# Patient Record
Sex: Male | Born: 1990 | Race: Black or African American | Hispanic: No | Marital: Single
Health system: Midwestern US, Community
[De-identification: ages and names within clinical notes are randomized; demographics above are authoritative.]

---

## 2012-05-31 ENCOUNTER — Inpatient Hospital Stay
Admit: 2012-05-31 | Discharge: 2012-06-01 | Disposition: A | Discharge status: Home / Self Care | Attending: Emergency Medicine

## 2012-05-31 NOTE — ED Provider Notes (Signed)
HPI:  05/31/2012,   Time: 8:58 PM         Dave Huffman is a 21 y.o. male presenting to the ED for Right hand and arm discomfort, beginning One week ago.  The complaint has been persistent, mild in severity, and worsened by nothing.  he denies any trauma denies any injury to his right hand. He states his hand hurts worse when he tries to look at his cell phone.    ROS:   Unless otherwise stated in this report or unable to obtain because of the patient's clinical or mental status as evidenced by the medical record, this patients's positive and negative responses for Review of Systems, constitutional, psych, eyes, ENT, cardiovascular, respiratory, gastrointestinal, neurological, genitourinary, musculoskeletal, integument systems and systems related to the presenting problem are either stated in the preceding or were not pertinent or were negative for the symptoms and/or complaints related to the medical problem.      --------------------------------------------- PAST HISTORY ---------------------------------------------  Past Medical History:  has no past medical history on file.    Past Surgical History:  has no past surgical history on file.    Social History:  reports that he has been smoking Cigarettes.  He has been smoking about 0.50 packs per day. He does not have any smokeless tobacco history on file. He reports that  drinks alcohol. He reports that he does not use illicit drugs.    Family History: family history is not on file.     The patient???s home medications have been reviewed.    Allergies: Review of patient's allergies indicates no known allergies.    -------------------------------------------------- RESULTS -------------------------------------------------  All laboratory and radiology results have been personally reviewed by myself   LABS:  No results found for this visit on 05/31/12.    RADIOLOGY:  Interpreted by Radiologist.  XR HAND RIGHT STANDARD    Final Result:        XR RADIUS ULNA RIGHT  STANDARD    Final Result:            ------------------------- NURSING NOTES AND VITALS REVIEWED ---------------------------   The nursing notes within the ED encounter and vital signs as below have been reviewed.   BP 116/68   Pulse 87   Temp(Src) 98.5 ??F (36.9 ??C) (Oral)   Resp 18   Ht 5\' 10"  (1.778 m)   Wt 168 lb (76.204 kg)   BMI 24.11 kg/m2   SpO2 98%  Oxygen Saturation Interpretation: Normal      ---------------------------------------------------PHYSICAL EXAM--------------------------------------      Constitutional/General: Alert and oriented x3, well appearing, non toxic in NAD  Head: NC/AT  Eyes: PERRL, EOMI  Mouth: Oropharynx clear, handling secretions, no trismus  Neck: Supple, full ROM, no meningeal signs  Pulmonary: Lungs clear to auscultation bilaterally, no wheezes, rales, or rhonchi. Not in respiratory distress  Cardiovascular:  Regular rate and rhythm, no murmurs, gallops, or rubs. 2+ distal pulses  Abdomen: Soft, non tender, non distended,   Extremities: Moves all extremities x 4. Warm and well perfusedNegative Tinel's sign for right hand. It appears swollen it is not appear contused it is not red.  Skin: warm and dry without rash  Neurologic: GCS 15,  Psych: Normal Affect      ------------------------------ ED COURSE/MEDICAL DECISION MAKING----------------------  Medications   naproxen (NAPROSYN) tablet 500 mg (500 mg Oral Given 05/31/12 2126)         Medical Decision Making:    Carpal tunnel syndrome    Counseling:  The emergency provider has spoken with the patient and discussed today???s results, in addition to providing specific details for the plan of care and counseling regarding the diagnosis and prognosis.  Questions are answered at this time and they are agreeable with the plan.      --------------------------------- IMPRESSION AND DISPOSITION ---------------------------------    IMPRESSION  1. Carpal tunnel syndrome        DISPOSITION  Disposition: Discharge to home  Patient condition is  good                  Harrison Mons, [place]  05/31/12 2103    ATTENDING PROVIDER ATTESTATION:     I have personally performed and/or participated in the history, exam, medical decision making, and procedures and agree with all pertinent clinical information.      I have also reviewed and agree with the past medical, family and social history unless otherwise noted.      Cathren Laine, MD  06/01/12 [phone]

## 2012-05-31 NOTE — ED Notes (Signed)
Discharge instructions given verbally and written, patient has no further concerns at this time.     Darrel Hoover, RN  05/31/12 2128

## 2012-05-31 NOTE — Discharge Instructions (Signed)
Carpal Tunnel Syndrome  The carpal tunnel is a narrow area located on the palm side of your wrist. The tunnel is formed by the wrist bones and ligaments. Nerves, blood vessels, and tendons pass through the carpal tunnel. Repeated wrist motion or certain diseases may cause swelling within the tunnel. This swelling pinches the main nerve in the wrist (median nerve) and causes the painful hand and arm condition called carpal tunnel syndrome.  CAUSES    Repeated wrist motions.   Wrist injuries.   Certain diseases like arthritis, diabetes, alcoholism, hyperthyroidism, and kidney failure.   Obesity.   Pregnancy.  SYMPTOMS    A "pins and needles" feeling in your fingers or hand.   Tingling or numbness in your fingers or hand.   An aching feeling in your entire arm.   Wrist pain that goes up your arm to your shoulder.   Pain that goes down into your palm or fingers.   A weak feeling in your hands.  DIAGNOSIS   Your caregiver will take your history and perform a physical exam. An electromyography test may be needed. This test measures electrical signals sent out by the muscles. The electrical signals are usually slowed by carpal tunnel syndrome. You may also need X-rays.  TREATMENT   Carpal tunnel syndrome may clear up by itself. Your caregiver may recommend a wrist splint or medicine such as a nonsteroidal anti-inflammatory medicine. Cortisone injections may help. Sometimes, surgery may be needed to free the pinched nerve.   HOME CARE INSTRUCTIONS    Take all medicine as directed by your caregiver. Only take over-the-counter or prescription medicines for pain, discomfort, or fever as directed by your caregiver.   If you were given a splint to keep your wrist from bending, wear it as directed. It is important to wear the splint at night. Wear the splint for as long as you have pain or numbness in your hand, arm, or wrist. This may take 1 to 2 months.   Rest your wrist from any activity that may be causing your  pain. If your symptoms are work-related, you may need to talk to your employer about changing to a job that does not require using your wrist.   Put ice on your wrist after long periods of wrist activity.   Put ice in a plastic bag.   Place a towel between your skin and the bag.   Leave the ice on for 15 to 20 minutes, 3 to 4 times a day.   Keep all follow-up visits as directed by your caregiver. This includes any orthopedic referrals, physical therapy, and rehabilitation. Any delay in getting necessary care could result in a delay or failure of your condition to heal.  SEEK IMMEDIATE MEDICAL CARE IF:    You have new, unexplained symptoms.   Your symptoms get worse and are not helped or controlled with medicines.  MAKE SURE YOU:    Understand these instructions.   Will watch your condition.   Will get help right away if you are not doing well or get worse.  Document Released: 05/27/2000 Document Revised: 08/22/2011 Document Reviewed: 04/15/2011  [facility] Patient Information 2013 [place].

## 2012-06-01 MED ORDER — NAPROXEN 500 MG PO TABS
500 MG | ORAL_TABLET | Freq: Two times a day (BID) | ORAL | Status: DC
Start: 2012-06-01 — End: 2015-04-25

## 2012-06-01 MED ADMIN — naproxen (NAPROSYN) tablet 500 mg: ORAL | @ 02:00:00 | NDC 00904606961

## 2012-06-01 MED FILL — NAPROXEN 250 MG PO TABS: 250 MG | ORAL | Qty: 2

## 2012-12-12 ENCOUNTER — Inpatient Hospital Stay
Admit: 2012-12-12 | Discharge: 2012-12-13 | Disposition: A | Discharge status: Home / Self Care | Attending: Emergency Medicine

## 2012-12-12 LAB — URINALYSIS
Bilirubin Urine: NEGATIVE
Blood, Urine: NEGATIVE
Glucose, Ur: NEGATIVE mg/dL
Ketones, Urine: NEGATIVE mg/dL
Leukocyte Esterase, Urine: NEGATIVE
Nitrite, Urine: NEGATIVE
Protein, UA: NEGATIVE mg/dL
Specific Gravity, UA: 1.02 (ref 1.005–1.030)
Urobilinogen, Urine: 2 E.U./dL — AB (ref <2.0)
pH, UA: 7.5 (ref 5.0–9.0)

## 2012-12-12 LAB — MICROSCOPIC URINALYSIS

## 2012-12-12 MED ORDER — LIDOCAINE HCL (PF) 1 % IJ SOLN
1 % | INTRAMUSCULAR | Status: AC
Start: 2012-12-12 — End: 2012-12-12
  Administered 2012-12-12: 23:00:00

## 2012-12-12 MED ORDER — CEFTRIAXONE SODIUM 1 G IJ SOLR
1 | Freq: Once | INTRAMUSCULAR | Status: AC
Start: 2012-12-12 — End: 2012-12-12
  Administered 2012-12-12: 23:00:00 via INTRAMUSCULAR

## 2012-12-12 MED ORDER — AZITHROMYCIN 250 MG PO TABS
250 | Freq: Once | ORAL | Status: AC
Start: 2012-12-12 — End: 2012-12-12
  Administered 2012-12-12: 23:00:00 via ORAL

## 2012-12-12 MED ORDER — ACYCLOVIR 800 MG PO TABS
800 MG | ORAL_TABLET | Freq: Every day | ORAL | Status: AC
Start: 2012-12-12 — End: 2012-12-19

## 2012-12-12 MED ORDER — TRAMADOL HCL 50 MG PO TABS
50 MG | ORAL_TABLET | Freq: Four times a day (QID) | ORAL | Status: AC | PRN
Start: 2012-12-12 — End: 2012-12-17

## 2012-12-12 MED FILL — CEFTRIAXONE SODIUM 1 G IJ SOLR: 1 g | INTRAMUSCULAR | Qty: 1

## 2012-12-12 MED FILL — AZITHROMYCIN 250 MG PO TABS: 250 MG | ORAL | Qty: 4

## 2012-12-12 MED FILL — LIDOCAINE HCL (PF) 1 % IJ SOLN: 1 % | INTRAMUSCULAR | Qty: 2

## 2012-12-12 NOTE — Discharge Instructions (Signed)
Genital Herpes  Genital herpes is a sexually transmitted disease. This means that it is a disease passed by having sex with an infected person. There is no cure for genital herpes. The time between attacks can be months to years. The virus may live in a person but produce no problems (symptoms). This infection can be passed to a baby as it travels down the birth canal (vagina). In a newborn, this can cause central nervous system damage, eye damage, or even death. The virus that causes genital herpes is usually HSV-2 virus. The virus that causes oral herpes is usually HSV-1. The diagnosis (learning what is wrong) is made through culture results.  SYMPTOMS   Usually symptoms of pain and itching begin a few days to a week after contact. It first appears as small blisters that progress to small painful ulcers which then scab over and heal after several days. It affects the outer genitalia, birth canal, cervix, penis, anal area, buttocks, and thighs.  HOME CARE INSTRUCTIONS    Keep ulcerated areas dry and clean.   Take medications as directed. Antiviral medications can speed up healing. They will not prevent recurrences or cure this infection. These medications can also be taken for suppression if there are frequent recurrences.   While the infection is active, it is contagious. Avoid all sexual contact during active infections.   Condoms may help prevent spread of the herpes virus.   Practice safe sex.   Wash your hands thoroughly after touching the genital area.   Avoid touching your eyes after touching your genital area.   Inform your caregiver if you have had genital herpes and become pregnant. It is your responsibility to insure a safe outcome for your baby in this pregnancy.   Only take over-the-counter or prescription medicines for pain, discomfort, or fever as directed by your caregiver.  SEEK MEDICAL CARE IF:    You have a recurrence of this infection.   You do not respond to medications and are not  improving.   You have new sources of pain or discharge which have changed from the original infection.   You have an oral temperature above 102 F (38.9 C).   You develop abdominal pain.   You develop eye pain or signs of eye infection.  Document Released: 05/27/2000 Document Revised: 08/22/2011 Document Reviewed: 06/17/2009  [facility] Patient Information 2014 [place], [place].  Sexually Transmitted Disease  Sexually transmitted disease (STD) refers to any infection that is passed from person to person during sexual activity. This may happen by way of saliva, semen, blood, vaginal mucus, or urine. Common STDs include:   Gonorrhea.   Chlamydia.   Syphilis.   HIV/AIDS.   Genital herpes.   Hepatitis B and C.   Trichomonas.   Human papillomavirus (HPV).   Pubic lice.  CAUSES   An STD may be spread by bacteria, virus, or parasite. A person can get an STD by:   Sexual intercourse with an infected person.   Sharing sex toys with an infected person.   Sharing needles with an infected person.   Having intimate contact with the genitals, mouth, or rectal areas of an infected person.  SYMPTOMS   Some people may not have any symptoms, but they can still pass the infection to others. Different STDs have different symptoms. Symptoms include:   Painful or bloody urination.   Pain in the pelvis, abdomen, vagina, anus, throat, or eyes.   Skin rash, itching, irritation, growths, or sores (lesions). These usually  occur in the genital or anal area.   Abnormal vaginal discharge.   Penile discharge in men.   Soft, flesh-colored skin growths in the genital or anal area.   Fever.   Pain or bleeding during sexual intercourse.   Swollen glands in the groin area.   Yellow skin and eyes (jaundice). This is seen with hepatitis.  DIAGNOSIS   To make a diagnosis, your caregiver may:   Take a medical history.   Perform a physical exam.   Take a specimen (culture) to be examined.   Examine a sample of discharge under a  microscope.   Perform blood tests.   Perform a Pap test, if this applies.   Perform a colposcopy.   Perform a laparoscopy.  TREATMENT    Chlamydia, gonorrhea, trichomonas, and syphilis can be cured with antibiotic medicine.   Genital herpes, hepatitis, and HIV can be treated, but not cured, with prescribed medicines. The medicines will lessen the symptoms.   Genital warts from HPV can be treated with medicine or by freezing, burning (electrocautery), or surgery. Warts may come back.   HPV is a virus and cannot be cured with medicine or surgery.However, abnormal areas may be followed very closely by your caregiver and may be removed from the cervix, vagina, or vulva through office procedures or surgery.  If your diagnosis is confirmed, your recent sexual partners need treatment. This is true even if they are symptom-free or have a negative culture or evaluation. They should not have sex until their caregiver says it is okay.  HOME CARE INSTRUCTIONS   All sexual partners should be informed, tested, and treated for all STDs.   Take your antibiotics as directed. Finish them even if you start to feel better.   Only take over-the-counter or prescription medicines for pain, discomfort, or fever as directed by your caregiver.   Rest.   Eat a balanced diet and drink enough fluids to keep your urine clear or pale yellow.   Do not have sex until treatment is completed and you have followed up with your caregiver. STDs should be checked after treatment.   Keep all follow-up appointments, Pap tests, and blood tests as directed by your caregiver.   Only use latex condoms and water-soluble lubricants during sexual activity. Do not use petroleum jelly or oils.   Avoid alcohol and illegal drugs.   Get vaccinated for HPV and hepatitis. If you have not received these vaccines in the past, talk to your caregiver about whether one or both might be right for you.   Avoid risky sex practices that can break the  skin.  The only way to avoid getting an STD is to avoid all sexual activity.Latex condoms and dental dams (for oral sex) will help lessen the risk of getting an STD, but will not completely eliminate the risk.  SEEK MEDICAL CARE IF:    You have a fever.   You have any new or worsening symptoms.  Document Released: 08/20/2002 Document Revised: 08/22/2011 Document Reviewed: 08/27/2010  [facility] Patient Information 2014 [place], [place].    Safer Sex: After Your Visit  Your Care Instructions  Safer sex is a way to reduce your risk of getting an infection spread through sex. It can also help prevent pregnancy. Most infections that are spread through sex, also called sexually transmitted infections or STIs, can be cured. But some can decrease your chances of getting pregnant if they are not treated early. Others, such as herpes, have no cure. And  some, such as HIV, can be deadly.  Several products can help you practice safer sex and reduce your chance of STIs. One of the best is a condom. There are condoms for men and for women. The male condom is a tube of soft plastic with a closed end that is placed deep into the vagina. You can use a special rubber sheet (dental dam) for protection during oral sex. Latex gloves can keep your hands from touching blood, semen, or other body fluids that can carry infections.  Remember that birth control methods such as diaphragms, IUDs, foams, and birth control pills do not stop you from getting STIs.  Follow-up care is a key part of your treatment and safety. Be sure to make and go to all appointments, and call your doctor if you are having problems. It's also a good idea to know your test results and keep a list of the medicines you take.  How can you care for yourself at home?   Think about getting shots to prevent hepatitis A and hepatitis B. These two diseases can be spread through sex. You also can get hepatitis A if you eat infected food.   Use condoms or male condoms  each time and every time you have sex.   Learn the right way to use a male condom:   Condoms come in several sizes. Make sure you use the right size. A condom that is too small can break easily. A condom that is too big can slip off during sex. Use a new condom each time you have sex.   Be careful not to poke a hole in the condom when you open the wrapper.   Squeeze the tip of the condom to keep out air.   Pull down the loose skin (foreskin) from the head of an uncircumcised penis.   While squeezing the tip of the condom, unroll it all the way down to the base of the firm penis.   Never use petroleum jelly (such as Vaseline), grease, hand lotion, baby oil, or anything with oil in it. These products can make holes in the condom.   After sex, hold the condom on your penis as you remove your penis from your partner. This will keep semen from spilling out of the condom.   Learn to use a male condom:   You can put in a male condom up to 8 hours before sex.   Squeeze the smaller ring at the closed end and insert it deep into the vagina. The larger ring at the open end should stay outside the vagina.   During sex, make sure the penis goes into the condom.   After the penis is removed, close the open end of the condom by twisting it. Remove the condom.   Do not use a male condom and male condom at the same time.   Do not have sex with anyone who has symptoms of an STI, such as sores on the genitals or mouth. The herpes virus that causes cold sores can spread to and from the penis and vagina.   Do not drink a lot of alcohol or use drugs before sex. This can cause you to let down your guard and not practice safer sex.   Having one sex partner (who does not have STIs and does not have sex with anyone else) is a sure way to avoid STIs.   Talk to your partner before you have sex. Find out if he or she  has or is at risk for any STI. Keep in mind that a person may be able to spread an STI even if he or she  does not have symptoms. You and your partner may want to get an HIV test. You should get tested again 6 months later.   Where can you learn more?   Go to [email or web address] and sign in to your MyChart account. Enter [phone] in the Search Health Information box to learn more about "Safer Sex: After Your Visit."    If you do not have an account, please click on the "Sign Up Now" link.      2006-2014 [employer]. Care instructions adapted under license by [facility]. This care instruction is for use with your licensed healthcare professional. If you have questions about a medical condition or this instruction, always ask your healthcare professional. [employer], Inc[employer] disclaims any warranty or liability for your use of this information.  Content Version: 10.0.273164; Last Revised: July 01, 2010

## 2012-12-12 NOTE — ED Provider Notes (Addendum)
HPI:  12/12/2012,   Time: 6:13 PM         Dave Huffman is a 22 y.o. male presenting to the ED for STD exposure, beginning one week ago.  The complaint has been constant, mild in severity, and worsened by nothing.  Ambulatory into the emergency room complaining of an exposure to STD. States he recently had unprotected intercourse with a male and one week later he began developing "bumps" to the base of the penis. He denies any penile discharge. He states he has intermittent episodes of some dysuria symptoms. His abdominal pain or fever. Denies any swelling or abnormality to the penis.    ROS:   Unless otherwise stated in this report or unable to obtain because of the patient's clinical or mental status as evidenced by the medical record, this patients's positive and negative responses for Review of Systems, constitutional, psych, eyes, ENT, cardiovascular, respiratory, gastrointestinal, neurological, genitourinary, musculoskeletal, integument systems and systems related to the presenting problem are either stated in the preceding or were not pertinent or were negative for the symptoms and/or complaints related to the medical problem.      --------------------------------------------- PAST HISTORY ---------------------------------------------  Past Medical History:  has no past medical history on file.    Past Surgical History:  has no past surgical history on file.    Social History:  reports that he has been smoking Cigarettes.  He has been smoking about 0.50 packs per day. He does not have any smokeless tobacco history on file. He reports that  drinks alcohol. He reports that he does not use illicit drugs.    Family History: family history is not on file.     The patient???s home medications have been reviewed.    Allergies: Review of patient's allergies indicates no known allergies.    -------------------------------------------------- RESULTS -------------------------------------------------  All laboratory and  radiology results have been personally reviewed by myself   LABS:  Results for orders placed during the hospital encounter of 12/12/12   URINALYSIS       Result Value Range    Color, UA Yellow  Straw/Yellow    Clarity, UA SL CLOUDY  Clear    Glucose, Ur Negative  Negative mg/dL    Bilirubin Urine Negative  Negative    Ketones, Urine Negative  Negative mg/dL    Specific Gravity, UA 1.020  1.005 - 1.030    Blood, Urine Negative  Negative    pH, UA 7.5  5.0 - 9.0    Protein, UA Negative  Negative mg/dL    Urobilinogen, Urine 2.0 (*) < 2.0 E.U./dL    Nitrite, Urine Negative  Negative    Leukocyte Esterase, Urine Negative  Negative   MICROSCOPIC URINALYSIS       Result Value Range    WBC, UA NONE  0 - 5 /HPF    RBC, UA 5-10 (*) 0 - 2 /HPF    Bacteria, UA NONE      Amorphous, UA MODERATE         RADIOLOGY:  Interpreted by Radiologist.       ------------------------- NURSING NOTES AND VITALS REVIEWED ---------------------------   The nursing notes within the ED encounter and vital signs as below have been reviewed.   BP 149/71   Pulse 74   Temp(Src) 98 ??F (36.7 ??C) (Oral)   Ht 5\' 11"  (1.803 m)   Wt 155 lb (70.308 kg)   BMI 21.63 kg/m2   SpO2 98%  Oxygen Saturation Interpretation: Normal      ---------------------------------------------------  PHYSICAL EXAM--------------------------------------      Constitutional/General: Alert and oriented x3, well appearing, non toxic in NAD  Head: NC/AT  Eyes: PERRL, EOMI  Mouth: Oropharynx clear, handling secretions, no trismus  Neck: Supple, full ROM, no meningeal signs  Pulmonary: Lungs clear to auscultation bilaterally, no wheezes, rales, or rhonchi. Not in respiratory distress  Cardiovascular:  Regular rate and rhythm, no murmurs, gallops, or rubs. 2+ distal pulses  Abdomen: Soft, non tender, non distended,   GU:  Erythematous,  Vesicular,  appearing rash to the base of the penis. There is no penile discharge.  Extremities: Moves all extremities x 4. Warm and well perfused  Skin:  warm and dry without rash  Neurologic: GCS 15,  Psych: Normal Affect      ------------------------------ ED COURSE/MEDICAL DECISION MAKING----------------------  Medications   cefTRIAXone (ROCEPHIN) injection 1 g (1 g Intramuscular Given 12/12/12 1851)   azithromycin (ZITHROMAX) tablet 1,000 mg (1,000 mg Oral Given 12/12/12 1851)   lidocaine 1 % (PF) injection (2 mLs  Given 12/12/12 1851)         Medical Decision Making:    1830- Examined by Dr. Margretta Ditty, instructed [facility] in the next 2-3 days if no improvement in symptoms. Patient was counseled at length on safe sex practices including consistent condom use.    Counseling:   The emergency provider has spoken with the patient and discussed today???s results, in addition to providing specific details for the plan of care and counseling regarding the diagnosis and prognosis.  Questions are answered at this time and they are agreeable with the plan.      --------------------------------- IMPRESSION AND DISPOSITION ---------------------------------    IMPRESSION  1. Genital herpes    2. Exposure to STD        DISPOSITION  Disposition: Discharge to home  Patient condition is good          ATTENDING PROVIDER ATTESTATION:     I have personally performed and/or participated in the history, exam, medical decision making, and procedures and agree with all pertinent clinical information.      I have also reviewed and agree with the past medical, family and social history unless otherwise noted.          Merian Capron, APRN  12/13/12 0115    I have examined the patient along with the mid-level provider, and I agree with the treatment and discharge plan    Gaspar Skeeters, MD  12/13/12 1154    Gaspar Skeeters, MD  04/07/13 2041

## 2012-12-14 LAB — CULTURE, HSV: Organism: DETECTED — AB

## 2012-12-19 LAB — C. TRACHOMATIS / N. GONORRHOEAE, DNA
C. Trachomatis Amplified: POSITIVE — AB
N. Gonorrhoeae Amplified: NEGATIVE

## 2013-06-24 ENCOUNTER — Inpatient Hospital Stay: Admit: 2013-06-24 | Discharge: 2013-06-24 | Attending: Emergency Medicine

## 2013-06-24 LAB — COMPREHENSIVE METABOLIC PANEL
ALT: 28 U/L (ref 10–40)
AST: 30 U/L (ref 15–37)
Albumin/Globulin Ratio: 1.3 (ref 1.1–2.2)
Albumin: 4.7 g/dL (ref 3.4–5.0)
Alkaline Phosphatase: 80 U/L (ref 40–129)
Anion Gap: 10 (ref 3–16)
BUN: 6 mg/dL — ABNORMAL LOW (ref 7–20)
CO2: 24 mmol/L (ref 21–32)
Calcium: 9.6 mg/dL (ref 8.3–10.6)
Chloride: 101 mmol/L (ref 99–110)
Creatinine: 1 mg/dL (ref 0.9–1.3)
GFR African American: >60 (ref >60)
GFR Non-African American: >60 (ref >60)
Globulin: 3.5 g/dL
Glucose: 101 mg/dL — ABNORMAL HIGH (ref 70–99)
Potassium: 3.9 mmol/L (ref 3.5–5.1)
Sodium: 135 mmol/L — ABNORMAL LOW (ref 136–145)
Total Bilirubin: 0.3 mg/dL (ref 0.0–1.0)
Total Protein: 8.2 g/dL (ref 6.4–8.2)

## 2013-06-24 LAB — CBC WITH AUTO DIFFERENTIAL
Basophils %: 0.2 %
Basophils Absolute: 0 10*3/uL (ref 0.0–0.2)
Eosinophils %: 0.4 %
Eosinophils Absolute: 0 10*3/uL (ref 0.0–0.6)
Hematocrit: 50.2 % (ref 40.5–52.5)
Hemoglobin: 16.1 g/dL (ref 13.5–17.5)
Lymphocytes %: 7.7 %
Lymphocytes Absolute: 1 10*3/uL (ref 1.0–5.1)
MCH: 31.7 pg (ref 26.0–34.0)
MCHC: 32 g/dL (ref 31.0–36.0)
MCV: 98.8 fL (ref 80.0–100.0)
MPV: 8.2 fL (ref 5.0–10.5)
Monocytes %: 3.8 %
Monocytes Absolute: 0.5 10*3/uL (ref 0.0–1.3)
Neutrophils %: 87.9 %
Neutrophils Absolute: 11.5 10*3/uL — ABNORMAL HIGH (ref 1.7–7.7)
Platelets: 186 10*3/uL (ref 135–450)
RBC: 5.08 M/uL (ref 4.20–5.90)
RDW: 12.7 % (ref 12.4–15.4)
WBC: 13.1 10*3/uL — ABNORMAL HIGH (ref 4.0–11.0)

## 2013-06-24 LAB — AMYLASE: Amylase: 154 U/L — ABNORMAL HIGH (ref 25–115)

## 2013-06-24 LAB — LIPASE: Lipase: 41 U/L (ref 13.0–60.0)

## 2013-06-24 MED ORDER — ONDANSETRON 4 MG PO TBDP
4 MG | ORAL_TABLET | Freq: Two times a day (BID) | ORAL | Status: AC | PRN
Start: 2013-06-24 — End: ?

## 2013-06-24 MED ADMIN — 0.9 % sodium chloride bolus: 1000 mL | INTRAVENOUS | @ 17:00:00 | NDC 00338004904

## 2013-06-24 MED ADMIN — morphine (PF) injection 4 mg: 4 mg | INTRAVENOUS | @ 17:00:00 | NDC 00409189101

## 2013-06-24 MED ADMIN — ondansetron (ZOFRAN) injection 4 mg: 4 mg | INTRAVENOUS | @ 17:00:00 | NDC 23155037831

## 2013-06-24 MED FILL — SODIUM CHLORIDE 0.9 % IV SOLN: 0.9 % | INTRAVENOUS | Qty: 1000

## 2013-06-24 MED FILL — MORPHINE SULFATE (PF) 4 MG/ML IV SOLN: 4 mg/mL | INTRAVENOUS | Qty: 1

## 2013-06-24 MED FILL — ONDANSETRON HCL 4 MG/2ML IJ SOLN: 4 MG/2ML | INTRAMUSCULAR | Qty: 2

## 2013-06-24 NOTE — ED Provider Notes (Signed)
Dave Huffman is a 23 y.o. male who presents with a complaint of   Chief Complaint   Patient presents with   ??? Abdominal Pain     Pt started with stomach ache last night   ??? Generalized Body Aches     Started last night   .    On my exam:  Vital Signs: BP 134/88   Pulse 71   Temp(Src) 97.9 ??F (36.6 ??C) (Oral)   Ht 5\' 10"  (1.778 m)   Wt 160 lb (72.576 kg)   BMI 22.96 kg/m2   SpO2 100%  General: Patient appears non-toxic    Heart: Regular rate and rhythm. No murmurs gallops noted.  Lungs: Breath sounds equal bilaterally and clear.  Abdomen: Soft, nondistended, minimal epigastric tenderness. No guarding or rebound. No masses or organomegaly.    Lab reviewed. H&H are normal. White blood count 13,100 with 88 neutrophils 8 lymphs. Sodium 135 with potassium 3.9. BUN of 6 with a creatinine of 1.0. Liver enzymes are normal. Amylase is mildly elevated at 154. Lipase is normal at 41.    His abdomen is benign. His presentation is consistent with a viral gastroenteritis. He was hydrated and given Zofran. He feels much better. He was discharged home with symptomatic treatment.    This patient was seen by the mid-level provider.  I have seen and examined the patient face to face, agree with the workup, evaluation, management and diagnosis.  Care plan has been discussed.      Electronically signed by: Dave Headingsimothy William Zyla Dascenzo, MD, 06/24/2013  1:10 PM      Dave Headingsimothy William Octavio Matheney, MD  06/24/13 1312

## 2013-06-24 NOTE — Discharge Instructions (Signed)
[facility] Referral number [id number] for Primary Care      [facility]  [facility]  [address] [address]  [phone]  Fax [phone]  Medical, OB/Gyn, Pediatrics, WIC  Serves all of [place] [facility]  [address]  [place], [place] [address]  [phone]       [facility]  [address] (Administrative offices)  [phone]  Homeless only [facility] ([place])  [address], [place] [address]  [phone] or [phone], Spanish [phone],   Dental Appointments [phone] or [phone]  Pediatric, Family Practice, X-Ray  Serves all of [place]     [facility]  [address]  [place], [place] [address]  [phone]   [facility]   [address]    (Located in [place])  [phone] or [phone], Spanish [phone],   Dental Appointments [phone] or [phone]     [facility]  [address]  [place], [place] [address]  [phone] [facility]  [address]  [phone]   [facility]  [address]  [address]  [phone] Fax [phone]  General Practice    Serves [place] and Surrounding areas [facility]  [address] [address]  [phone] Fax [phone]  Medical, OB/Gyn, Pediatrics  Dental Clinic, WIC [place] limits only     [facility]  [address] [address]  [phone] Fax [phone]  Family Practice, Pediatrics, OB/Gyn, WIC  Dental Clinic [phone]  Serves [place] area   [facility]  [address]    [phone]  Urgent Care, Open 24 hrs, Urgent care, Gyn, Prenatal, Dental Mental, Translators     [facility] [place]   [address] [place] [address] [phone]  (Located: [place] [facility])  Pediatrics [phone], Spanish [phone],   Dental Appointments [phone] or [phone]  WIC [phone] [facility]  [address] [address]  [phone]  Medical, OB/Gyn, Pediatrics, WIC  Dental Clinic [phone]       [place]  [facility]  [address], [place]  [address]  [phone] Fax [phone]  Pediatrics, WIC   [facility]  [address] [address]  [phone]   Fax [phone]  Pediatrics, WIC     [facility]  [address] [address]  [phone]  Medical Clinic   [facility]  [address] [address]  [phone]  Pediatrics, Internal Med, OB/Gyn, WIC, Dental Clinic [phone]     [facility]  [address]  [place] [address]   [phone] [facility]  [address] [address]  [phone] Fax [phone]  General Medical Clinic  Sliding scale fee  All [place] area     HOSPITAL CLINICS    [facility]  [address]  [place], [place] [address]  [phone]     [facility]  [address]  [place], [place] [address]  [phone]    [facility]   [address]  [place], [place] [address]  [phone]         [facility]  [address]  [place], [place] [address]  [facility]  [phone]  Fax:  [phone]  [facility]  [phone]  Fax:  [phone]  [facility]  Resident Practice  [phone]  Fax:  [place]  [phone]  Fax:  [phone]  Orthopaedics  [phone]     [place] ([place])  [address]  [place], [place] [id number]  [phone]    Cherlynn Perches (Continued)    [facility]   [facility] [place])  [id number]  [place], [place] [address]  [place]    ([facility])   [address] route [place], [place] [address]  [phone]  [phone] [place]   [facility] Source of [place])  [address] [place], [place] [address]  [phone]       [facility]  ([place])  [place] [address], [place] [address]  [place]  (Health [facility] of [place])  [place], [place] [address]  [place]  ([place])  [place], [place] [address]  [phone]   [place] ([place])  [place], [place] [address]  [phone]    [place], [place], [place] [address]  [phone]  General Family Practice, Pediatrics  Services all areas sliding scale fee     [place] [facility]  [address] [place]  [address]  [facility], Pediatrics, [place]   ([facility])  [place], [place] [address]  [place] ([place])  [address]  [place]  [address]  [place]  [facility] ([place])  [place].   [facility]  [id number]  Gyn, Prenatal, Dental, Mental, [place]   [phone]               Nausea and Vomiting: After Your Visit  Your Care Instructions     When you are nauseated, you may feel weak and sweaty and notice a lot of saliva in your mouth. Nausea often leads to vomiting. Most of the time you do not need to worry about nausea and vomiting, but they can be signs of other illnesses.  Two common causes of nausea and vomiting are stomach  flu and food poisoning. Nausea and vomiting from viral stomach flu will usually start to improve within 24 hours. Nausea and vomiting from food poisoning may last from 12 to 48 hours.  The doctor has checked you carefully, but problems can develop later. If you notice any problems or new symptoms, get medical treatment right away.  Follow-up care is a key part of your treatment and safety. Be sure to make and go to all appointments, and call your doctor if you are having problems. It's also a good idea to know your test results and keep a list of the medicines you take.  How can you care for yourself at  home?   To prevent dehydration, drink plenty of fluids, enough so that your urine is light yellow or clear like water. Choose water and other caffeine-free clear liquids until you feel better. If you have kidney, heart, or liver disease and have to limit fluids, talk with your doctor before you increase the amount of fluids you drink.   Rest in bed until you feel better.   When you are able to eat, try clear soups, mild foods, and liquids until all symptoms are gone for 12 to 48 hours. Other good choices include dry toast, crackers, cooked cereal, and gelatin dessert, such as Jell-O.  When should you call for help?  Call 911 anytime you think you may need emergency care. For example, call if:   You passed out (lost consciousness).  Call your doctor now or seek immediate medical care if:   You have symptoms of dehydration, such as:   Dry eyes and a dry mouth.   Passing only a little dark urine.   Feeling thirstier than usual.   You have new or worsening belly pain.   You have a new or higher fever.   You vomit blood or what looks like coffee grounds.  Watch closely for changes in your health, and be sure to contact your doctor if:   You have ongoing nausea and vomiting.   Your vomiting is getting worse.   Your vomiting lasts longer than 2 days.   You are not getting better as expected.   Where can you  learn more?   Go to [email or web address] and sign in to your MyChart account. Enter H591 in the [place] box to learn more about "Nausea and Vomiting: After Your Visit."    If you do not have an account, please click on the "Sign Up Now" link.      2006-2014 Healthwise, Incorporated. Care instructions adapted under license by [facility]. This care instruction is for use with your licensed healthcare professional. If you have questions about a medical condition or this instruction, always ask your healthcare professional. [place] any warranty or liability for your use of this information.  Content Version: 10.3.368381; Current as of: November 14, 2012                Diarrhea: After Your Visit  Your Care Instructions     Diarrhea is loose, watery stools (bowel movements). The exact cause is often hard to find. Sometimes diarrhea is your body's way of getting rid of what caused an upset stomach. Viruses, food poisoning, and many medicines can cause diarrhea. Some people get diarrhea in response to emotional stress, anxiety, or certain foods.  Almost everyone has diarrhea now and then. It usually isn't serious, and your stools will return to normal soon. The important thing to do is replace the fluids you have lost, so you can prevent dehydration.  The doctor has checked you carefully, but problems can develop later. If you notice any problems or new symptoms, get medical treatment right away.  Follow-up care is a key part of your treatment and safety. Be sure to make and go to all appointments, and call your doctor if you are having problems. It's also a good idea to know your test results and keep a list of the medicines you take.  How can you care for yourself at home?   Watch for signs of dehydration, which means your body has lost too much water. Dehydration is a serious condition and  should be treated right away. Signs of dehydration are:   Increasing  thirst and dry eyes and mouth.   Feeling faint or lightheaded.   Darker urine, and a smaller amount of urine than normal.   To prevent dehydration, drink plenty of fluids, enough so that your urine is light yellow or clear like water. Choose water and other caffeine-free clear liquids until you feel better. If you have kidney, heart, or liver disease and have to limit fluids, talk with your doctor before you increase the amount of fluids you drink.   Begin eating small amounts of mild foods the next day, if you feel like it.   Try yogurt that has live cultures of Lactobacillus. (Check the label.)   Avoid spicy foods, fruits, alcohol, and caffeine until 48 hours after all symptoms are gone.   Avoid chewing gum that contains sorbitol.   Avoid dairy products (except for yogurt with Lactobacillus) while you have diarrhea and for 3 days after symptoms are gone.   The doctor may recommend that you take over-the-counter medicine, such as loperamide (Imodium), if you still have diarrhea after 6 hours. Read and follow all instructions on the label. Do not use this medicine if you have bloody diarrhea, a high fever, or other signs of serious illness. Call your doctor if you think you are having a problem with your medicine.  When should you call for help?  Call 911 anytime you think you may need emergency care. For example, call if:   You passed out (lost consciousness).   Your stools are maroon or very bloody.  Call your doctor now or seek immediate medical care if:   You are dizzy or lightheaded, or you feel like you may faint.   Your stools are black and look like tar, or they have streaks of blood.   You have new or worse belly pain.   You have symptoms of dehydration, such as:   Dry eyes and a dry mouth.   Passing only a little dark urine.   Feeling thirstier than usual.   You have a new or higher fever.  Watch closely for changes in your health, and be sure to contact your doctor if:   Your diarrhea  is getting worse.   You see pus in the diarrhea.   You are not getting better after 2 days (48 hours).   Where can you learn more?   Go to [email or web address] and sign in to your MyChart account. Enter [phone] in the [place] box to learn more about "Diarrhea: After Your Visit."    If you do not have an account, please click on the "Sign Up Now" link.      2006-2014 Healthwise, Incorporated. Care instructions adapted under license by [facility]. This care instruction is for use with your licensed healthcare professional. If you have questions about a medical condition or this instruction, always ask your healthcare professional. [place] any warranty or liability for your use of this information.  Content Version: 10.3.368381; Current as of: November 14, 2012

## 2013-06-24 NOTE — ED Provider Notes (Signed)
[facility] EMERGENCY DEPT  eMERGENCY dEPARTMENT eNCOUnter      Pt Name: Dave Huffman  MRN: [id number]  [place] 02-14-1991  Date of evaluation: 06/24/2013  Provider: Ralene Cork, PA-C    CHIEF COMPLAINT       Chief Complaint   Patient presents with   ??? Abdominal Pain     Pt started with stomach ache last night   ??? Generalized Body Aches     Started last night           HISTORY OF PRESENT ILLNESS  (Location/Symptom, Timing/Onset, Context/Setting, Quality, Duration, Modifying Factors, Severity.)   Dave Dusing is a 23 y.o. male who presents to the emergency department with complaint of epigastric cramping moderate in intensity no radiation nausea vomiting and diarrhea over the past 12 hours. Patient is presently afebrile he is hemodynamically stable. Patient has positive sick contacts. He denies any alleviating or worsening factors.    Nursing Notes were reviewed.    REVIEW OF SYSTEMS    (2-9 systems for level 4, 10 or more for level 5)       Constitutional: No fevers   GI:  vomiting or hematemesis no hematochezia  Cardiac: No chest pain       Except as noted above the remainder of the review of systems was reviewed and negative.       PAST MEDICAL HISTORY   History reviewed. No pertinent past medical history.    SURGICAL HISTORY     History reviewed. No pertinent past surgical history.    CURRENT MEDICATIONS       Discharge Medication List as of 06/24/2013  1:18 PM          ALLERGIES     Review of patient's allergies indicates no known allergies.    FAMILY HISTORY     History reviewed. No pertinent family history.  No family status information on file.        SOCIAL HISTORY      reports that he has been smoking.  He does not have any smokeless tobacco history on file. He reports that he drinks alcohol. He reports that he uses illicit drugs (Marijuana) about 7 times per week.    PHYSICAL EXAM    (up to 7 for level 4, 8 or more for level 5)   ED Triage Vitals   BP Temp Temp Source Pulse Resp SpO2 Height Weight    06/24/13 1051 06/24/13 1051 06/24/13 1051 06/24/13 1051 -- 06/24/13 1051 06/24/13 1051 06/24/13 1051   134/88 mmHg 97.9 ??F (36.6 ??C) Oral 71  100 % 5' 10"$  (1.778 m) 160 lb (72.576 kg)           General Appearance:  Nontoxic     Head:  Normocephalic, without obvious abnormality, atraumatic.    Eyes:  conjunctiva/corneas clear    ENT: Mucous membranes moist.    Neck:   Supple     Cardiac: regular rate and rhythm    Lungs:  Clear to auscultation bilaterally    Abdomen:  Nontender,   Nondistended,    positive bowel sounds,   no rebound no hepatosplenomegaly no pulsatile mass    GU:  No CVA tenderness    Extremities:  no edema    Musculoskeletal:   No chest wall tenderness    Skin:  No rashes or lesions to exposed skin.    Neurologic: Alert.    No gross focal deficits    Psychiatric:  Normal affect  DIAGNOSTIC RESULTS              LABS:  Labs Reviewed   CBC WITH AUTO DIFFERENTIAL - Abnormal; Notable for the following:     WBC 13.1 (*)     Neutrophils Absolute 11.5 (*)     All other components within normal limits   AMYLASE - Abnormal; Notable for the following:     Amylase 154 (*)     All other components within normal limits   COMPREHENSIVE METABOLIC PANEL - Abnormal; Notable for the following:     Sodium 135 (*)     Glucose 101 (*)     BUN 6 (*)     All other components within normal limits   LIPASE           EMERGENCY DEPARTMENT COURSE and DIFFERENTIAL DIAGNOSIS/MDM:   Vitals:    Filed Vitals:    06/24/13 1051 06/24/13 1331   BP: 134/88 104/64   Pulse: 71 60   Temp: 97.9 ??F (36.6 ??C)    TempSrc: Oral    Resp:  18   Height: 5' 10"$  (1.778 m)    Weight: 160 lb (72.576 kg)    SpO2: 100% 100%       Patient is a well-appearing 23 year old male who presents with nausea vomiting and diarrhea. Serial abdominal exams show a nontender abdomen symptoms consistent with a viral gastroenteritis labs grossly normal. Patient was treated with fluids and antiemetics with significant improvement patient will be discharged home  with symptomatic treatment. I estimate there is LOW risk for ACUTE APPENDICITIS, BOWEL OBSTRUCTION, CHOLECYSTITIS, DIVERTICULITIS, INCARCERATED HERNIA, PANCREATITIS, or PERFORATED BOWEL or ULCER, thus I consider the discharge disposition reasonable. Also, there is no evidence or peritonitis, sepsis, or toxicity. The patient and/or family and I have discussed the diagnosis and risks, and we agree with discharging home to follow-up with their primary doctor. We also discussed returning to the Emergency Department immediately if new or worsening symptoms occur. We have discussed the symptoms which are most concerning (e.g., bloody stool, fever, changing or worsening pain, vomiting) that necessitate immediate return.                     CONSULTS:  None    PROCEDURES:  None    FINAL IMPRESSION      1. Nausea vomiting and diarrhea          DISPOSITION/PLAN   DISPOSITION     PATIENT REFERRED TO:  see clinic list          [facility]  [address]  [place] [address]  [phone]    As needed      DISCHARGE MEDICATIONS:  Discharge Medication List as of 06/24/2013  1:18 PM      START taking these medications    Details   ondansetron (ZOFRAN ODT) 4 MG disintegrating tablet Take 1-2 tablets by mouth every 12 hours as needed for Nausea., Disp-12 tablet, R-0             (Please note that portions of this note were completed with a voice recognition program.  Efforts were made to edit the dictations but occasionally words are mis-transcribed.)    Ralene Cork, PA-C  Attending Emergency Physician        Ralene Cork, PA-C  06/24/13 [phone]

## 2013-06-24 NOTE — ED Notes (Signed)
sts nausea relieved    Sarina SerHannah Sherrian Nunnelley, RN  06/24/13 1227

## 2015-04-19 ENCOUNTER — Emergency Department: Admit: 2015-04-19

## 2015-04-19 ENCOUNTER — Inpatient Hospital Stay: Admit: 2015-04-19 | Discharge: 2015-04-19 | Disposition: A | Discharge status: Home / Self Care

## 2015-04-19 DIAGNOSIS — S86892A Other injury of other muscle(s) and tendon(s) at lower leg level, left leg, initial encounter: Secondary | ICD-10-CM

## 2015-04-19 MED ORDER — IBUPROFEN 800 MG PO TABS
800 MG | Freq: Once | ORAL | Status: AC
Start: 2015-04-19 — End: 2015-04-19
  Administered 2015-04-19: 20:00:00 800 mg via ORAL

## 2015-04-19 MED ORDER — IBUPROFEN 800 MG PO TABS
800 MG | ORAL_TABLET | Freq: Four times a day (QID) | ORAL | 0 refills | Status: AC | PRN
Start: 2015-04-19 — End: 2015-04-24

## 2015-04-19 MED FILL — IBUPROFEN 800 MG PO TABS: 800 MG | ORAL | Qty: 1

## 2015-04-19 NOTE — ED Provider Notes (Signed)
Independent  HPI:  04/19/15, Time: 2:15 PM         Dave Huffman is a 24 y.o. male presenting to the ED for left knee and shin pain, knee beginning 2 years ago and shin pain over the last few days.  The complaint has been persistent, moderate in severity, and worsened by walking.  Patient walks great distances as he has no car and has been having knee pain for the past couple of years centered on the medial joint line and the patellar tendon. Over the past few days the patient has developed left shin pain centered over the muscle of the distal aspect of the lower leg.    Review of Systems:   Pertinent positives and negatives are stated within HPI, all other systems reviewed and are negative.          --------------------------------------------- PAST HISTORY ---------------------------------------------  Past Medical History:  has no past medical history on file.    Past Surgical History:  has no past surgical history on file.    Social History:  reports that he has been smoking Cigarettes.  He has been smoking about 0.50 packs per day. He has never used smokeless tobacco. He reports that he drinks alcohol. He reports that he uses illicit drugs, including Marijuana.    Family History: family history is not on file.     The patient???s home medications have been reviewed.    Allergies: Review of patient's allergies indicates no known allergies.    -------------------------------------------------- RESULTS -------------------------------------------------  All laboratory and radiology results have been personally reviewed by myself   LABS:  No results found for this visit on 04/19/15.    RADIOLOGY:  Interpreted by Radiologist.  XR Knee Left Min 4 VW   Final Result         1. No convincing evidence of acute fracture or dislocation.    2. Well-corticated ossific body inferior aspect of patella noted   perhaps related to remote injury or chronic tendinopathy. Please   correlate exam findings and history.              ------------------------- NURSING NOTES AND VITALS REVIEWED ---------------------------   The nursing notes within the ED encounter and vital signs as below have been reviewed.   Visit Vitals   ??? BP (!) 146/92   ??? Pulse 72   ??? Temp 97.7 ??F (36.5 ??C) (Temporal)   ??? Resp 14   ??? Ht  (1.778 m)   ??? Wt 155 lb (70.3 kg)   ??? SpO2 97%   ??? BMI 22.24 kg/m2     Oxygen Saturation Interpretation: Normal      ---------------------------------------------------PHYSICAL EXAM--------------------------------------      Constitutional/General: Alert and oriented x3, well appearing, non toxic in NAD  Head: Normocephalic and atraumatic  Eyes: PERRL, EOMI  Mouth: Oropharynx clear, handling secretions, no trismus  Neck: Supple, full ROM,   Pulmonary: Lungs clear to auscultation bilaterally, no wheezes, rales, or rhonchi. Not in respiratory distress  Cardiovascular:  Regular rate and rhythm, no murmurs, gallops, or rubs. 2+ distal pulses  Abdomen: Soft, non tender, non distended,   Extremities: Moves all extremities x 4. Warm and well perfused. Patient has medial joint line tenderness of the left knee with pain in leg extension over the patellar tendon.patient is also very tender over the anterior tibialis muscle adjacent to the tibia of the lower left leg.the patient is able to perform a straight leg raise against gravity with pain to the  patellar tendon.  Skin: warm and dry without rash  Neurologic: GCS 15,  Psych: Normal Affect      ------------------------------ ED COURSE/MEDICAL DECISION MAKING----------------------  Medications - No data to display      ED COURSE:  ED Course       Medical Decision Making:    Patient educated about the x-ray results and recommended follow-up with orthopedics.  Patient also offered crutches and Ace wrap.  Patient also educated to switch transportation mode to a bicycle of all possible.    Counseling:   The emergency provider has spoken with the patient and discussed today???s results, in  addition to providing specific details for the plan of care and counseling regarding the diagnosis and prognosis.  Questions are answered at this time and they are agreeable with the plan.      --------------------------------- IMPRESSION AND DISPOSITION ---------------------------------    IMPRESSION  1. Shin splint, left, initial encounter    2. Tendon disorder        DISPOSITION  Disposition: Discharge to home  Patient condition is good      NOTE: This report was transcribed using voice recognition software. Every effort was made to ensure accuracy; however, inadvertent computerized transcription errors may be present           Maurene CapesChristopher Beila Purdie, PA-C  04/19/15 1448

## 2015-04-25 ENCOUNTER — Inpatient Hospital Stay
Admit: 2015-04-25 | Discharge: 2015-04-26 | Disposition: A | Discharge status: Home / Self Care | Attending: Emergency Medicine

## 2015-04-25 MED ORDER — IBUPROFEN 800 MG PO TABS
800 MG | Freq: Once | ORAL | Status: AC
Start: 2015-04-25 — End: 2015-04-25
  Administered 2015-04-26: 01:00:00 800 mg via ORAL

## 2015-04-25 NOTE — ED Notes (Signed)
Knee immobilizer applied to left knee, discharge packet reviewed, patient verbalized understanding of medications and follow up     Garlan FairMalissa G Eaton Folmar, RN  04/25/15 2028
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
## 2015-04-26 MED ORDER — NAPROXEN 500 MG PO TABS
500 MG | ORAL_TABLET | Freq: Two times a day (BID) | ORAL | 0 refills | Status: AC
Start: 2015-04-26 — End: 2015-05-09

## 2015-04-26 MED ORDER — TETANUS-DIPHTH-ACELL PERTUSSIS 5-2.5-18.5 LF-MCG/0.5 IM SUSP
Freq: Once | INTRAMUSCULAR | Status: AC
Start: 2015-04-26 — End: 2015-04-25
  Administered 2015-04-26: 01:00:00 0.5 mL via INTRAMUSCULAR

## 2015-04-26 MED FILL — IBUPROFEN 800 MG PO TABS: 800 MG | ORAL | Qty: 1

## 2017-01-04 ENCOUNTER — Emergency Department (HOSPITAL_COMMUNITY): Payer: Self-pay

## 2017-01-04 ENCOUNTER — Encounter (HOSPITAL_COMMUNITY): Payer: Self-pay | Admitting: *Deleted

## 2017-01-04 ENCOUNTER — Emergency Department (HOSPITAL_COMMUNITY)
Admission: EM | Admit: 2017-01-04 | Discharge: 2017-01-04 | Disposition: A | Discharge status: Home / Self Care | Payer: Self-pay | Attending: Physician Assistant | Admitting: Physician Assistant

## 2017-01-04 DIAGNOSIS — K920 Hematemesis: Secondary | ICD-10-CM | POA: Insufficient documentation

## 2017-01-04 DIAGNOSIS — R1010 Upper abdominal pain, unspecified: Secondary | ICD-10-CM | POA: Insufficient documentation

## 2017-01-04 DIAGNOSIS — F17200 Nicotine dependence, unspecified, uncomplicated: Secondary | ICD-10-CM | POA: Insufficient documentation

## 2017-01-04 DIAGNOSIS — K279 Peptic ulcer, site unspecified, unspecified as acute or chronic, without hemorrhage or perforation: Secondary | ICD-10-CM | POA: Insufficient documentation

## 2017-01-04 LAB — COMPREHENSIVE METABOLIC PANEL
ALT: 22 U/L (ref 17–63)
AST: 24 U/L (ref 15–41)
Albumin: 4.7 g/dL (ref 3.5–5.0)
Alkaline Phosphatase: 61 U/L (ref 38–126)
Anion gap: 9 (ref 5–15)
BUN: 9 mg/dL (ref 6–20)
CHLORIDE: 105 mmol/L (ref 101–111)
CO2: 24 mmol/L (ref 22–32)
Calcium: 9.8 mg/dL (ref 8.9–10.3)
Creatinine, Ser: 1.12 mg/dL (ref 0.61–1.24)
GFR calc non Af Amer: >60 mL/min (ref >60)
Glucose, Bld: 88 mg/dL (ref 65–99)
POTASSIUM: 4.2 mmol/L (ref 3.5–5.1)
SODIUM: 138 mmol/L (ref 135–145)
Total Bilirubin: 0.8 mg/dL (ref 0.3–1.2)
Total Protein: 7.8 g/dL (ref 6.5–8.1)

## 2017-01-04 LAB — CBC
HEMATOCRIT: 46.3 % (ref 39.0–52.0)
HEMOGLOBIN: 17 g/dL (ref 13.0–17.0)
MCH: 32.8 pg (ref 26.0–34.0)
MCHC: 36.7 g/dL — ABNORMAL HIGH (ref 30.0–36.0)
MCV: 89.4 fL (ref 78.0–100.0)
Platelets: 192 10*3/uL (ref 150–400)
RBC: 5.18 MIL/uL (ref 4.22–5.81)
RDW: 12.5 % (ref 11.5–15.5)
WBC: 11.6 10*3/uL — AB (ref 4.0–10.5)

## 2017-01-04 LAB — LIPASE, BLOOD: LIPASE: 35 U/L (ref 11–51)

## 2017-01-04 LAB — URINALYSIS, ROUTINE W REFLEX MICROSCOPIC
BACTERIA UA: NONE SEEN
Bilirubin Urine: NEGATIVE
GLUCOSE, UA: NEGATIVE mg/dL
Hgb urine dipstick: NEGATIVE
KETONES UR: NEGATIVE mg/dL
LEUKOCYTES UA: NEGATIVE
Nitrite: NEGATIVE
PROTEIN: 30 mg/dL — AB
RBC / HPF: NONE SEEN RBC/hpf (ref 0–5)
Specific Gravity, Urine: 1.024 (ref 1.005–1.030)
WBC UA: NONE SEEN WBC/hpf (ref 0–5)
pH: 7 (ref 5.0–8.0)

## 2017-01-04 LAB — POC OCCULT BLOOD, ED: Fecal Occult Bld: NEGATIVE

## 2017-01-04 MED ORDER — GI COCKTAIL ~~LOC~~
30.0000 mL | Freq: Once | ORAL | Status: AC
  Administered 2017-01-04: 30 mL via ORAL
  Filled 2017-01-04: qty 30

## 2017-01-04 MED ORDER — RANITIDINE HCL 150 MG PO CAPS: 150.0000 mg | ORAL_CAPSULE | Freq: Every day | ORAL | 0 refills | Status: DC

## 2017-01-04 MED ORDER — OMEPRAZOLE 20 MG PO CPDR: 20.0000 mg | DELAYED_RELEASE_CAPSULE | Freq: Every day | ORAL | 0 refills | Status: DC

## 2017-01-04 NOTE — ED Provider Notes (Signed)
[facility]-EMERGENCY DEPT Provider Note   CSN: [id number] Arrival date & time: 01/04/17  1546     History   Chief Complaint Chief Complaint  Patient presents with  . Emesis  . Abdominal Pain    HPI Drew Simmons is a 26 y.o. male.  HPI   26 year old male presenting for evaluation of nausea and vomiting. Patient admits that he drinks alcohol on a daily basis and usually drinks at least a 40 ounce baby. This morning after drinking some alcohol and smoking a cigarette, he developed upper abdominal pain which he described as a burning sensation. He begins to feel very nauseous, and has been vomited multiple times since. States he vomited at least 20 times. Initially vomitus is of yellow emesis, followed by a dry heave, and subsequently he had 3 separate episodes of vomitus with blood. He felt that is a moderate amount of blood in the toilet bowl. He still endorsed some nausea but while waiting his symptom has improved. Endorse mild to moderate upper abdominal discomfort this time. He denies fever, chills, lightheadedness, dizziness, chest pain. Does endorse mild shortness of breath when his abdominal pain is intense. He has not noticed any dark black stool. He denies using any NSAIDs on a regular basis. Does admits to using marijuana regularly. Does have history of GERD and stomach ulcer but does not take any medication for that. Has had similar upper abdominal pain like this in the past but not as severe. No recent sick contact, recent travel or taken any antibiotic on regular basis. No history of PE or DVT.     History reviewed. No pertinent past medical history.  There are no active problems to display for this patient.   History reviewed. No pertinent surgical history.     Home Medications    Prior to Admission medications   Not on File    Family History History reviewed. No pertinent family history.  Social History Social History  Substance Use Topics  . Smoking status:  Current Every Day Smoker  . Smokeless tobacco: Not on file  . Alcohol use Yes     Comment: occ     Allergies   Patient has no known allergies.   Review of Systems Review of Systems  All other systems reviewed and are negative.    Physical Exam Updated Vital Signs BP 124/75   Pulse 74   Temp 98.5 F (36.9 C)   Resp 15   SpO2 100%   Physical Exam  Constitutional: He appears well-developed and well-nourished. No distress.  HENT:  Head: Atraumatic.  Normal oral mucosa  Eyes: Conjunctivae are normal.  Neck: Neck supple.  Cardiovascular: Normal rate and regular rhythm.   Pulmonary/Chest: Effort normal and breath sounds normal.  Abdominal: Soft. Bowel sounds are normal. He exhibits no distension. There is tenderness (Mild epigastric tenderness without rebound or guarding).  Neurological: He is alert.  Skin: No rash noted.  Psychiatric: He has a normal mood and affect.  Nursing note and vitals reviewed.    ED Treatments / Results  Labs (all labs ordered are listed, but only abnormal results are displayed) Labs Reviewed  CBC - Abnormal; Notable for the following:       Result Value   WBC 11.6 (*)    MCHC 36.7 (*)    All other components within normal limits  URINALYSIS, ROUTINE W REFLEX MICROSCOPIC - Abnormal; Notable for the following:    Protein, ur 30 (*)    Squamous Epithelial / LPF  0-5 (*)    All other components within normal limits  LIPASE, BLOOD  COMPREHENSIVE METABOLIC PANEL  POC OCCULT BLOOD, ED    EKG  EKG Interpretation None       Radiology Dg Abdomen 1 View  Result Date: 01/04/2017 CLINICAL DATA:  Upper abdominal pain and hematemesis since this morning. EXAM: ABDOMEN - 1 VIEW COMPARISON:  None. FINDINGS: Mildly prominent air-filled small bowel, likely nonobstructive. No extraluminal gas. No biliary or urinary calculi. IMPRESSION: Negative for bowel obstruction or perforation. Prominent air-filled small bowel may be reactive. Electronically  Signed   By: Ellery Plunkaniel R Mitchell M.D.   On: 01/04/2017 22:10    Procedures Procedures (including critical care time)  Medications Ordered in ED Medications  gi cocktail (Maalox,Lidocaine,Donnatal) (30 mLs Oral Given 01/04/17 2112)     Initial Impression / Assessment and Plan / ED Course  I have reviewed the triage vital signs and the nursing notes.  Pertinent labs & imaging results that were available during my care of the patient were reviewed by me and considered in my medical decision making (see chart for details).     BP 124/75   Pulse 74   Temp 98.5 F (36.9 C)   Resp 15   SpO2 100%    Final Clinical Impressions(s) / ED Diagnoses   Final diagnoses:  Hematemesis with nausea  PUD (peptic ulcer disease)    New Prescriptions New Prescriptions   OMEPRAZOLE (PRILOSEC) 20 MG CAPSULE    Take 1 capsule (20 mg total) by mouth daily.   RANITIDINE (ZANTAC) 150 MG CAPSULE    Take 1 capsule (150 mg total) by mouth daily.   9:04 PM Patient with history of alcohol abuse here with upper abdominal discomfort follows with nausea and vomiting and hematemesis. He is well-appearing, mildly tender abdomen but no peritonitis. Labs are reassuring, normal lipase. Suspect hematemesis likely secondary to Mallory-Weiss tear and not likely to be due to esophageal varies or Boerhaave.  History of peptic ulcers and GERD. Doubt PE causing his sxs.  GI cocktail given, workup initiated.  10:51 PM Fecal occult blood test is negative. Urinalysis without any signs of urinary tract infection. Patient has normal lipase. Electrolytes panel are reassuring, normal H&H. Vital signs stable, patient felt better after receiving GI cocktail.  11:06 PM I encouraged patient to avoid using alcohol, and marijuana as it may worsen his symptoms. Encouraged patient to follow-up with GI specialist for further management. Patient discharged with PPI and H2 blocker. Return precaution discussed.   Fayrene Helperran, Allysa Governale,  PA-C 01/04/17 2306    Corlis LeakMackuen, Cindee Saltourteney Lyn, MD 01/07/17 [id number]

## 2017-01-04 NOTE — Discharge Instructions (Signed)
Please avoid alcohol use and marijuana use as it may worsen your abdominal pain.  Take prilosec and zantac 30 minutes before major meal.  Follow up with GI specialist for further evaluation of your condition. Return to the ER if you have any concerns.

## 2017-01-04 NOTE — ED Triage Notes (Addendum)
Pt reports onset of generalized abd pain with n/v/d this am. Reports now has blood tinged emesis.

## 2017-03-01 ENCOUNTER — Encounter (HOSPITAL_COMMUNITY): Payer: Self-pay | Admitting: Emergency Medicine

## 2017-03-01 ENCOUNTER — Emergency Department (HOSPITAL_COMMUNITY)
Admission: EM | Admit: 2017-03-01 | Discharge: 2017-03-01 | Disposition: A | Discharge status: Home / Self Care | Payer: PRIVATE HEALTH INSURANCE | Attending: Emergency Medicine | Admitting: Emergency Medicine

## 2017-03-01 DIAGNOSIS — K279 Peptic ulcer, site unspecified, unspecified as acute or chronic, without hemorrhage or perforation: Secondary | ICD-10-CM | POA: Diagnosis not present

## 2017-03-01 DIAGNOSIS — R1013 Epigastric pain: Secondary | ICD-10-CM

## 2017-03-01 DIAGNOSIS — F17210 Nicotine dependence, cigarettes, uncomplicated: Secondary | ICD-10-CM | POA: Diagnosis not present

## 2017-03-01 LAB — CBC
HEMATOCRIT: 42.9 % (ref 39.0–52.0)
Hemoglobin: 15.6 g/dL (ref 13.0–17.0)
MCH: 32.4 pg (ref 26.0–34.0)
MCHC: 36.4 g/dL — ABNORMAL HIGH (ref 30.0–36.0)
MCV: 89.2 fL (ref 78.0–100.0)
PLATELETS: 154 10*3/uL (ref 150–400)
RBC: 4.81 MIL/uL (ref 4.22–5.81)
RDW: 12.3 % (ref 11.5–15.5)
WBC: 5.7 10*3/uL (ref 4.0–10.5)

## 2017-03-01 LAB — COMPREHENSIVE METABOLIC PANEL
ALT: 24 U/L (ref 17–63)
AST: 25 U/L (ref 15–41)
Albumin: 4.5 g/dL (ref 3.5–5.0)
Alkaline Phosphatase: 63 U/L (ref 38–126)
Anion gap: 5 (ref 5–15)
BILIRUBIN TOTAL: 0.5 mg/dL (ref 0.3–1.2)
BUN: 8 mg/dL (ref 6–20)
CO2: 28 mmol/L (ref 22–32)
Calcium: 9.6 mg/dL (ref 8.9–10.3)
Chloride: 105 mmol/L (ref 101–111)
Creatinine, Ser: 1.2 mg/dL (ref 0.61–1.24)
Glucose, Bld: 93 mg/dL (ref 65–99)
POTASSIUM: 3.9 mmol/L (ref 3.5–5.1)
Sodium: 138 mmol/L (ref 135–145)
Total Protein: 7.5 g/dL (ref 6.5–8.1)

## 2017-03-01 LAB — URINALYSIS, ROUTINE W REFLEX MICROSCOPIC
Bilirubin Urine: NEGATIVE
Glucose, UA: NEGATIVE mg/dL
Hgb urine dipstick: NEGATIVE
KETONES UR: NEGATIVE mg/dL
LEUKOCYTES UA: NEGATIVE
NITRITE: NEGATIVE
PH: 6 (ref 5.0–8.0)
PROTEIN: NEGATIVE mg/dL
Specific Gravity, Urine: 1.003 — ABNORMAL LOW (ref 1.005–1.030)

## 2017-03-01 LAB — LIPASE, BLOOD: LIPASE: 34 U/L (ref 11–51)

## 2017-03-01 MED ORDER — ONDANSETRON 4 MG PO TBDP
4.0000 mg | ORAL_TABLET | Freq: Once | ORAL | Status: DC | PRN
  Filled 2017-03-01: qty 1

## 2017-03-01 MED ORDER — GI COCKTAIL ~~LOC~~
30.0000 mL | Freq: Once | ORAL | Status: AC
  Administered 2017-03-01: 30 mL via ORAL
  Filled 2017-03-01: qty 30

## 2017-03-01 MED ORDER — RANITIDINE HCL 150 MG PO CAPS: 150.0000 mg | ORAL_CAPSULE | Freq: Every day | ORAL | 0 refills | Status: DC

## 2017-03-01 MED ORDER — ESOMEPRAZOLE MAGNESIUM 40 MG PO CPDR: 40.0000 mg | DELAYED_RELEASE_CAPSULE | Freq: Two times a day (BID) | ORAL | 0 refills | Status: DC

## 2017-03-01 MED ORDER — ONDANSETRON 4 MG PO TBDP: 4.0000 mg | ORAL_TABLET | Freq: Three times a day (TID) | ORAL | 0 refills | Status: DC | PRN

## 2017-03-01 MED ORDER — SUCRALFATE 1 G PO TABS
1.0000 g | ORAL_TABLET | Freq: Once | ORAL | Status: AC
  Administered 2017-03-01: 1 g via ORAL
  Filled 2017-03-01: qty 1

## 2017-03-01 MED ORDER — SUCRALFATE 1 G PO TABS
1.0000 g | ORAL_TABLET | Freq: Three times a day (TID) | ORAL | 0 refills | Status: DC
Start: 2017-03-01 — End: 2017-12-24

## 2017-03-01 MED ORDER — FAMOTIDINE IN NACL 20-0.9 MG/50ML-% IV SOLN
20.0000 mg | Freq: Once | INTRAVENOUS | Status: AC
  Administered 2017-03-01: 20 mg via INTRAVENOUS
  Filled 2017-03-01: qty 50

## 2017-03-01 NOTE — ED Triage Notes (Signed)
Patient reports that he has been dealing with abd pain and vomiting since July.  Patient reports that he was seen at hospital before for symptoms and was told peptic ulcers.  Patient c/o mid abd pain and vomiting but denies blood in emesis "this time".  Patient reports he has missed a lot of work and has paper work that needs to be filled out.  Patient reports that he never followed up with a GI specialist after being seen for ulcers.

## 2017-03-01 NOTE — Discharge Instructions (Signed)
Take reflux medication as prescribed. Zofran as needed for nausea. I have attached information about foods you should avoid. Follow up with primary care physician or gastroenterology for reevaluation of your symptoms. Return to the ED if any concerning signs or symptoms develop such as fevers, blood in your urine or stool, or if you're unable to keep down any fluids.

## 2017-03-01 NOTE — ED Provider Notes (Signed)
[facility]-EMERGENCY DEPT Provider Note   CSN: [id number] Arrival date & time: 03/01/17  1012     History   Chief Complaint Chief Complaint  Patient presents with  . Abdominal Pain  . Emesis    HPI [place] Kowal is a 26 y.o. male with history of peptic ulcer disease who presents today with chief complaint gradual onset, progressively worsening epigastric abdominal pain with associated nausea and vomiting.he states that he was seen and evaluated for his PUD in July and was  Found to be stable for discharge home and given Prilosec and Zantac as well as Zofran. He states that he took these medications for a few days with improvement but then stopped taking them "because I thought that they weren't working that well ". He states that since he stopped taking these medications he has had recurrence in his intermittent epigastric pain which she describes as gnawing in nature and does not radiate.he will experience this pain while at after he is eating, worse in the mornings with improvement throughout the day. He endorses occasional nausea and vomiting, with worsening last night. He has not tried anything for his symptoms most recently. He denies chest pain, shortness of breath, fevers, chills, urinary symptoms, melena, hematochezia. He denies hematemesis at this time. He states that he has been trying to eat healthier with some improvement in his symptoms, and has also cut back on alcohol use, cigarette smoking, and marijuana use. He states that he was unable to follow up with GI "because I was too busy". 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   The history is provided by the patient.    History reviewed. No pertinent past medical history.  There are no active problems to display for this patient.   History reviewed. No pertinent  surgical history.     Home Medications    Prior to Admission medications   Medication Sig Start Date End Date Taking? Authorizing Provider  esomeprazole (NEXIUM) 40 MG capsule Take 1 capsule (40 mg total) by mouth 2 (two) times daily before a meal. 03/01/17 03/31/17  Darthy Manganelli A, PA-C  ondansetron (ZOFRAN ODT) 4 MG disintegrating tablet Take 1 tablet (4 mg total) by mouth every 8 (eight) hours as needed for nausea or vomiting. 03/01/17   Luevenia Maxin, Leiann Sporer A, PA-C  ranitidine (ZANTAC) 150 MG capsule Take 1 capsule (150 mg total) by mouth daily. 03/01/17 03/31/17  Michela Pitcher A, PA-C  sucralfate (CARAFATE) 1 g tablet Take 1 tablet (1 g total) by mouth 4 (four) times daily -  with meals and at bedtime. 03/01/17 03/31/17  Jeanie Sewer, PA-C    Family History No family history on file.  Social History Social History  Substance Use Topics  . Smoking status: Current Every Day Smoker    Types: Cigarettes  . Smokeless tobacco: Never Used  . Alcohol use Yes     Comment: occ     Allergies   Patient has no known allergies.   Review of Systems Review of Systems  Constitutional: Negative for chills and fever.  Respiratory: Negative for shortness of breath.   Cardiovascular: Negative for chest pain.  Gastrointestinal: Positive for abdominal pain, nausea and vomiting. Negative for blood in stool, constipation and diarrhea.  Genitourinary: Negative for dysuria and hematuria.  All other  systems reviewed and are negative.    Physical Exam Updated Vital Signs BP (!) 145/81 (BP Location: Right Arm)   Pulse 73   Temp 99.1 F (37.3 C) (Oral)   Resp 18   Ht  (1.778 m)   Wt 77.1 kg (170 lb)   SpO2 99%   BMI 24.39 kg/m   Physical Exam  Constitutional: He appears well-developed and well-nourished. No distress.  Resting comfortably in bed  HENT:  Head: Normocephalic and atraumatic.  Eyes: Conjunctivae are normal. Right eye exhibits no discharge. Left eye exhibits no discharge.    Neck: Normal range of motion. Neck supple. No JVD present. No tracheal deviation present.  Cardiovascular: Normal rate, regular rhythm, normal heart sounds and intact distal pulses.   Pulmonary/Chest: Effort normal and breath sounds normal. No respiratory distress. He has no wheezes. He has no rales. He exhibits no tenderness.  Abdominal: Soft. Bowel sounds are normal. He exhibits no distension and no mass. There is tenderness. There is no rebound and no guarding.  Mild epigastric TTP, Murphy sign absent, Rovsing sign absent, no TTP at McBurney's point, no CVA tenderness  Musculoskeletal: He exhibits no edema.  Neurological: He is alert.  Skin: Skin is warm and dry. No erythema.  Psychiatric: He has a normal mood and affect. His behavior is normal.  Nursing note and vitals reviewed.    ED Treatments / Results  Labs (all labs ordered are listed, but only abnormal results are displayed) Labs Reviewed  CBC - Abnormal; Notable for the following:       Result Value   MCHC 36.4 (*)    All other components within normal limits  LIPASE, BLOOD  COMPREHENSIVE METABOLIC PANEL  URINALYSIS, ROUTINE W REFLEX MICROSCOPIC    EKG  EKG Interpretation None       Radiology No results found.  Procedures Procedures (including critical care time)  Medications Ordered in ED Medications  ondansetron (ZOFRAN-ODT) disintegrating tablet 4 mg (not administered)  famotidine (PEPCID) IVPB 20 mg premix (20 mg Intravenous New Bag/Given 03/01/17 1404)  sucralfate (CARAFATE) tablet 1 g (1 g Oral Given 03/01/17 1403)  gi cocktail (Maalox,Lidocaine,Donnatal) (30 mLs Oral Given 03/01/17 1404)     Initial Impression / Assessment and Plan / ED Course  I have reviewed the triage vital signs and the nursing notes.  Pertinent labs & imaging results that were available during my care of the patient were reviewed by me and considered in my medical decision making (see chart for details).     Patient with  history of peptic ulcer disease presents with ongoing symptoms. Afebrile, vital signs are stable. He has not followed up with primary care or GI and he stopped taking the reflux medications he was given although they were helpful as admitted by the patient. Examination of the abdomen shows mild epigastric tenderness but no evidence of peritonitis. Labwork is reassuring without electrolyte abnormality, anemia, or elevated lipase. Doubt pancreatitis, appendicitis, obstruction, perforation, or other acute surgical abdominal pathology. No CP or SOB, and I doubt ACS/MI in an otherwise healthy young male. Patient improved after being given fluids, Prilosec, GI cocktail, and Pepcid. He is tolerating PO, ambulatory without difficulty, and resting comfortably in bed. Repeat abdominal examination is unremarkable. Stable for discharge home with refills of medications, emphasized the importance of proper diet and medication regimen He will follow-up with primary care or GI for reevaluation. Discussed indications for return to the ED. Pt verbalized understanding of and agreement with plan and is  safe for discharge home at this time.   Final Clinical Impressions(s) / ED Diagnoses   Final diagnoses:  PUD (peptic ulcer disease)  Epigastric pain    New Prescriptions New Prescriptions   ESOMEPRAZOLE (NEXIUM) 40 MG CAPSULE    Take 1 capsule (40 mg total) by mouth 2 (two) times daily before a meal.   ONDANSETRON (ZOFRAN ODT) 4 MG DISINTEGRATING TABLET    Take 1 tablet (4 mg total) by mouth every 8 (eight) hours as needed for nausea or vomiting.   SUCRALFATE (CARAFATE) 1 G TABLET    Take 1 tablet (1 g total) by mouth 4 (four) times daily -  with meals and at bedtime.     Jeanie Sewer, PA-C 03/01/17 1452    Lorre Nick, MD 03/01/17 [id number]

## 2017-03-01 NOTE — ED Notes (Signed)
Pt tolerated PO fluids

## 2017-03-01 NOTE — ED Notes (Signed)
Patient states he cannot give urine at this time- he just "literally went to the bathroom." Patient eating fruit snacks

## 2017-12-24 ENCOUNTER — Encounter (HOSPITAL_COMMUNITY): Payer: Self-pay | Admitting: Emergency Medicine

## 2017-12-24 ENCOUNTER — Emergency Department (HOSPITAL_COMMUNITY)
Admission: EM | Admit: 2017-12-24 | Discharge: 2017-12-24 | Disposition: A | Discharge status: Home / Self Care | Payer: Medicaid Other | Attending: Emergency Medicine | Admitting: Emergency Medicine

## 2017-12-24 ENCOUNTER — Emergency Department (HOSPITAL_COMMUNITY): Payer: Medicaid Other

## 2017-12-24 DIAGNOSIS — R109 Unspecified abdominal pain: Secondary | ICD-10-CM

## 2017-12-24 DIAGNOSIS — Z79899 Other long term (current) drug therapy: Secondary | ICD-10-CM | POA: Insufficient documentation

## 2017-12-24 DIAGNOSIS — F17210 Nicotine dependence, cigarettes, uncomplicated: Secondary | ICD-10-CM | POA: Insufficient documentation

## 2017-12-24 DIAGNOSIS — K859 Acute pancreatitis without necrosis or infection, unspecified: Secondary | ICD-10-CM

## 2017-12-24 DIAGNOSIS — Z9114 Patient's other noncompliance with medication regimen: Secondary | ICD-10-CM | POA: Insufficient documentation

## 2017-12-24 LAB — COMPREHENSIVE METABOLIC PANEL
ALT: 21 U/L (ref 0–44)
AST: 20 U/L (ref 15–41)
Albumin: 4.1 g/dL (ref 3.5–5.0)
Alkaline Phosphatase: 57 U/L (ref 38–126)
Anion gap: 6 (ref 5–15)
BUN: 10 mg/dL (ref 6–20)
CALCIUM: 9.4 mg/dL (ref 8.9–10.3)
CO2: 25 mmol/L (ref 22–32)
CREATININE: 1.12 mg/dL (ref 0.61–1.24)
Chloride: 110 mmol/L (ref 98–111)
Glucose, Bld: 84 mg/dL (ref 70–99)
Potassium: 4 mmol/L (ref 3.5–5.1)
Sodium: 141 mmol/L (ref 135–145)
TOTAL PROTEIN: 6.9 g/dL (ref 6.5–8.1)
Total Bilirubin: 0.6 mg/dL (ref 0.3–1.2)

## 2017-12-24 LAB — CBC
HEMATOCRIT: 41.2 % (ref 39.0–52.0)
HEMOGLOBIN: 14.7 g/dL (ref 13.0–17.0)
MCH: 32.5 pg (ref 26.0–34.0)
MCHC: 35.7 g/dL (ref 30.0–36.0)
MCV: 91.2 fL (ref 78.0–100.0)
Platelets: 166 10*3/uL (ref 150–400)
RBC: 4.52 MIL/uL (ref 4.22–5.81)
RDW: 12.5 % (ref 11.5–15.5)
WBC: 5.6 10*3/uL (ref 4.0–10.5)

## 2017-12-24 LAB — URINALYSIS, ROUTINE W REFLEX MICROSCOPIC
Bilirubin Urine: NEGATIVE
GLUCOSE, UA: NEGATIVE mg/dL
HGB URINE DIPSTICK: NEGATIVE
Ketones, ur: NEGATIVE mg/dL
LEUKOCYTES UA: NEGATIVE
Nitrite: NEGATIVE
PH: 6 (ref 5.0–8.0)
PROTEIN: NEGATIVE mg/dL
SPECIFIC GRAVITY, URINE: 1.018 (ref 1.005–1.030)

## 2017-12-24 LAB — LIPASE, BLOOD: LIPASE: 123 U/L — AB (ref 11–51)

## 2017-12-24 MED ORDER — FAMOTIDINE IN NACL 20-0.9 MG/50ML-% IV SOLN
20.0000 mg | Freq: Once | INTRAVENOUS | Status: AC
  Administered 2017-12-24: 20 mg via INTRAVENOUS
  Filled 2017-12-24: qty 50

## 2017-12-24 MED ORDER — OXYCODONE-ACETAMINOPHEN 5-325 MG PO TABS: 1.0000 | ORAL_TABLET | Freq: Four times a day (QID) | ORAL | 0 refills | Status: DC | PRN

## 2017-12-24 MED ORDER — IOPAMIDOL (ISOVUE-300) INJECTION 61%
100.0000 mL | Freq: Once | INTRAVENOUS | Status: AC | PRN
  Administered 2017-12-24: 100 mL via INTRAVENOUS

## 2017-12-24 MED ORDER — SODIUM CHLORIDE 0.9 % IV BOLUS
500.0000 mL | Freq: Once | INTRAVENOUS | Status: AC
  Administered 2017-12-24: 500 mL via INTRAVENOUS

## 2017-12-24 MED ORDER — ONDANSETRON HCL 4 MG/2ML IJ SOLN
4.0000 mg | Freq: Once | INTRAMUSCULAR | Status: AC
  Administered 2017-12-24: 4 mg via INTRAVENOUS
  Filled 2017-12-24: qty 2

## 2017-12-24 MED ORDER — FENTANYL CITRATE (PF) 100 MCG/2ML IJ SOLN
50.0000 ug | Freq: Once | INTRAMUSCULAR | Status: AC
  Administered 2017-12-24: 50 ug via INTRAVENOUS
  Filled 2017-12-24: qty 2

## 2017-12-24 MED ORDER — ONDANSETRON 4 MG PO TBDP: 4.0000 mg | ORAL_TABLET | Freq: Three times a day (TID) | ORAL | 0 refills | Status: DC | PRN

## 2017-12-24 MED ORDER — GI COCKTAIL ~~LOC~~
30.0000 mL | Freq: Once | ORAL | Status: AC
  Administered 2017-12-24: 30 mL via ORAL
  Filled 2017-12-24: qty 30

## 2017-12-24 MED ORDER — IOPAMIDOL (ISOVUE-300) INJECTION 61%
INTRAVENOUS | Status: AC
  Filled 2017-12-24: qty 100

## 2017-12-24 NOTE — Discharge Instructions (Addendum)
You are seen in the emergency department today for abdominal pain.  Your labs look suspicious for an acute pancreatitis, please see the attached handout for further information regarding this diagnosis.  With acute pancreatitis we are sending you home with pain medication and nausea medication.  Percocet is the pain medication we are sending you home with, this is a narcotic pain medication, this is have potential to have addicting qualities, you may take this 1 to 2 tablets every 6 hours as needed for severe pain.  Please be aware this is Tylenol in it to avoid exceeding the maximum dose of tylenol in a day.  Do not drive or operate heavy machinery when you take this medicine.  Do not drink alcohol when you take this medicine.  We are additionally send you home with Zofran, you may take this every 8 hours as needed for nausea, let this disintegrate underneath your tongue.  We have prescribed you new medication(s) today. Discuss the medications prescribed today with your pharmacist as they can have adverse effects and interactions with your other medicines including over the counter and prescribed medications. Seek medical evaluation if you start to experience new or abnormal symptoms after taking one of these medicines, seek care immediately if you start to experience difficulty breathing, feeling of your throat closing, facial swelling, or rash as these could be indications of a more serious allergic reaction  We would like you to follow the clear liquid diet information provided in your discharge instruction for the next 48 hours.  If you are feeling better you may start to slowly introduce bland foods into your diet after 48 hours.  Please follow-up with the gastroenterologist, abdominal specialist, and your discharge instructions within 1 to 3 days for reevaluation.  Return to the ER anytime for new or worsening symptoms including but not limited to worsening pain, inability to keep fluids down, fever, or  any other concerns that you may have.

## 2017-12-24 NOTE — ED Notes (Signed)
Provider at bedside

## 2017-12-24 NOTE — ED Triage Notes (Signed)
Patient here from home with complaints of abdominal pain. Reports that he has a hx of ulcers "I think they are acting up". Nausea, dry heaving.

## 2017-12-24 NOTE — ED Provider Notes (Signed)
[facility]-EMERGENCY DEPT Provider Note   CSN: [id number] Arrival date & time: 12/24/17  1446     History   Chief Complaint Chief Complaint  Patient presents with  . Abdominal Pain  . Back Pain  . Nausea  . Emesis    HPI Drew Simmons is a 27 y.o. male with a hx of tobacco abuse and peptic ulcer disease who presents to the ED with complaints of abdominal pain that started this AM. Patient states pain is located in the epigastric region, radiates to back. Describes pain as a burning/gnawing/achiness. Pain is constant, 10/10 in severity, sometimes worse with deep breath, no other specific alleviating/aggravating factors. Reports associated nausea with episodes of dry heaving, 1 episode of non bloody emesis. He did have some loose stools last night. He reports sxs are similar to previous problems with PUD- he did not get medications filled from last ER visit for similar due to lack of insurance at the time, he has not followed up with GI. Denies fever, chills, dyspnea, chest pain, blood in stool, hematemesis, dysuria, or testicular pain/swelling. He consumed 1 beer yesterday, otherwise no alcohol. Did have french fries yesterday as well.   HPI  History reviewed. No pertinent past medical history.  There are no active problems to display for this patient.   History reviewed. No pertinent surgical history.      Home Medications    Prior to Admission medications   Medication Sig Start Date End Date Taking? Authorizing Provider  esomeprazole (NEXIUM) 40 MG capsule Take 1 capsule (40 mg total) by mouth 2 (two) times daily before a meal. 03/01/17 03/31/17  Fawze, Mina A, PA-C  ondansetron (ZOFRAN ODT) 4 MG disintegrating tablet Take 1 tablet (4 mg total) by mouth every 8 (eight) hours as needed for nausea or vomiting. 03/01/17   Luevenia Maxin, Mina A, PA-C  ranitidine (ZANTAC) 150 MG capsule Take 1 capsule (150 mg total) by mouth daily. 03/01/17 03/31/17  Michela Pitcher A, PA-C    sucralfate (CARAFATE) 1 g tablet Take 1 tablet (1 g total) by mouth 4 (four) times daily -  with meals and at bedtime. 03/01/17 03/31/17  Jeanie Sewer, PA-C    Family History No family history on file.  Social History Social History   Tobacco Use  . Smoking status: Current Every Day Smoker    Types: Cigarettes  . Smokeless tobacco: Never Used  Substance Use Topics  . Alcohol use: Yes    Comment: occ  . Drug use: Yes    Types: Marijuana     Allergies   Patient has no known allergies.   Review of Systems Review of Systems  Constitutional: Negative for chills and fever.  Respiratory: Negative for shortness of breath.   Cardiovascular: Negative for chest pain.  Gastrointestinal: Positive for abdominal pain, diarrhea, nausea and vomiting. Negative for anal bleeding, blood in stool and constipation.  Genitourinary: Negative for dysuria, scrotal swelling and testicular pain.  All other systems reviewed and are negative.    Physical Exam Updated Vital Signs BP 110/68 (BP Location: Left Arm)   Pulse 79   Temp 98.6 F (37 C) (Oral)   Resp 18   SpO2 98%   Physical Exam  Constitutional: He appears well-developed and well-nourished. No distress.  HENT:  Head: Normocephalic and atraumatic.  Eyes: Conjunctivae are normal. Right eye exhibits no discharge. Left eye exhibits no discharge.  Cardiovascular: Normal rate and regular rhythm.  No murmur heard. Pulmonary/Chest: Breath sounds normal. No  respiratory distress. He has no wheezes. He has no rales.  Abdominal: Soft. He exhibits no distension. There is tenderness (more prominent epigastric, mild RUQ) in the right upper quadrant and epigastric area. There is no rigidity, no rebound, no guarding, no tenderness at McBurney's point and negative Murphy's sign.  Neurological: He is alert.  Clear speech.   Skin: Skin is warm and dry. No rash noted.  Psychiatric: He has a normal mood and affect. His behavior is normal.  Nursing  note and vitals reviewed.  ED Treatments / Results  Labs Results for orders placed or performed during the hospital encounter of 12/24/17  Lipase, blood  Result Value Ref Range   Lipase 123 (H) 11 - 51 U/L  Comprehensive metabolic panel  Result Value Ref Range   Sodium 141 135 - 145 mmol/L   Potassium 4.0 3.5 - 5.1 mmol/L   Chloride 110 98 - 111 mmol/L   CO2 25 22 - 32 mmol/L   Glucose, Bld 84 70 - 99 mg/dL   BUN 10 6 - 20 mg/dL   Creatinine, Ser 1.611.12 0.61 - 1.24 mg/dL   Calcium 9.4 8.9 - 09.610.3 mg/dL   Total Protein 6.9 6.5 - 8.1 g/dL   Albumin 4.1 3.5 - 5.0 g/dL   AST 20 15 - 41 U/L   ALT 21 0 - 44 U/L   Alkaline Phosphatase 57 38 - 126 U/L   Total Bilirubin 0.6 0.3 - 1.2 mg/dL   GFR calc non Af Amer >60 >60 mL/min   GFR calc Af Amer >60 >60 mL/min   Anion gap 6 5 - 15  CBC  Result Value Ref Range   WBC 5.6 4.0 - 10.5 K/uL   RBC 4.52 4.22 - 5.81 MIL/uL   Hemoglobin 14.7 13.0 - 17.0 g/dL   HCT 04.541.2 40.939.0 - 81.152.0 %   MCV 91.2 78.0 - 100.0 fL   MCH 32.5 26.0 - 34.0 pg   MCHC 35.7 30.0 - 36.0 g/dL   RDW 91.412.5 78.211.5 - 95.615.5 %   Platelets 166 150 - 400 K/uL  Urinalysis, Routine w reflex microscopic  Result Value Ref Range   Color, Urine YELLOW YELLOW   APPearance CLEAR CLEAR   Specific Gravity, Urine 1.018 1.005 - 1.030   pH 6.0 5.0 - 8.0   Glucose, UA NEGATIVE NEGATIVE mg/dL   Hgb urine dipstick NEGATIVE NEGATIVE   Bilirubin Urine NEGATIVE NEGATIVE   Ketones, ur NEGATIVE NEGATIVE mg/dL   Protein, ur NEGATIVE NEGATIVE mg/dL   Nitrite NEGATIVE NEGATIVE   Leukocytes, UA NEGATIVE NEGATIVE  EKG None  Radiology Ct Abdomen Pelvis W Contrast  Result Date: 12/24/2017 CLINICAL DATA:  Abdominal pain. History of ulcers. Nausea and dry heaving. EXAM: CT ABDOMEN AND PELVIS WITH CONTRAST TECHNIQUE: Multidetector CT imaging of the abdomen and pelvis was performed using the standard protocol following bolus administration of intravenous contrast. CONTRAST:  100mL ISOVUE-300 IOPAMIDOL  (ISOVUE-300) INJECTION 61% COMPARISON:  None. FINDINGS: Lower chest: Normal size heart without pericardial effusion. Clear lung bases. Hepatobiliary: Normal Pancreas: No mass, ductal dilatation or inflammation of the pancreas. Spleen: Normal size spleen without acute abnormality. Adrenals/Urinary Tract: Adrenal glands are unremarkable. Kidneys are normal, without renal calculi, focal lesion, or hydronephrosis. Bladder is unremarkable. Stomach/Bowel: Stomach is within normal limits. Appendix appears normal. There is mild circumferential perirectal thickening with slight perirectal fatty induration. Proctitis might have this appearance. Remainder of the small and large intestine are unremarkable. Moderate distention of the stomach with ingested food. Moderate stool  burden within the right colon. Vascular/Lymphatic: No significant vascular findings are present. No enlarged abdominal or pelvic lymph nodes. Reproductive: Prostate is unremarkable. Other: No abdominal wall hernia or abnormality. No abdominopelvic ascites. Musculoskeletal: No acute or significant osseous findings. IMPRESSION: 1. Mild transmural thickening of the rectum with mild perirectal soft tissue induration question proctitis. 2. Increased colonic stool burden.  No bowel obstruction. Electronically Signed   By: Tollie Eth M.D.   On: 12/24/2017 21:49   US Abdomen Limited Ruq  Result Date: 12/24/2017 CLINICAL DATA:  26 y/o  M; acute abdominal pain. EXAM: ULTRASOUND ABDOMEN LIMITED RIGHT UPPER QUADRANT COMPARISON:  None. FINDINGS: Gallbladder: Mild gallbladder wall thickening measuring 3.8 mm. No cholelithiasis. No pericholecystic fluid. Negative sonographic Murphy's sign. Common bile duct: Diameter: 1.9 mm Liver: No focal lesion identified. Within normal limits in parenchymal echogenicity. Portal vein is patent on color Doppler imaging with normal direction of blood flow towards the liver. IMPRESSION: Gallbladder wall thickening. No gallstone. No  biliary ductal dilatation. Findings are nonspecific and may represent cholecystitis, reactive changes due to local inflammation, heart failure, or hypoproteinemia. Electronically Signed   By: Mitzi Hansen M.D.   On: 12/24/2017 19:49    Procedures Procedures (including critical care time)  Medications Ordered in ED Medications  iopamidol (ISOVUE-300) 61 % injection (has no administration in time range)  famotidine (PEPCID) IVPB 20 mg premix (0 mg Intravenous Stopped 12/24/17 1900)  gi cocktail (Maalox,Lidocaine,Donnatal) (30 mLs Oral Given 12/24/17 1814)  sodium chloride 0.9 % bolus 500 mL (0 mLs Intravenous Stopped 12/24/17 1918)  fentaNYL (SUBLIMAZE) injection 50 mcg (50 mcg Intravenous Given 12/24/17 2138)  ondansetron (ZOFRAN) injection 4 mg (4 mg Intravenous Given 12/24/17 2138)  iopamidol (ISOVUE-300) 61 % injection 100 mL (100 mLs Intravenous Contrast Given 12/24/17 2107)     Initial Impression / Assessment and Plan / ED Course  I have reviewed the triage vital signs and the nursing notes.  Pertinent labs & imaging results that were available during my care of the patient were reviewed by me and considered in my medical decision making (see chart for details).   Patient presents with complaint of abdominal pain. Patient nontoxic appearing, no apparent distress, initial vitals WNL. On initial exam epigastric tenderness with some RUQ tenderness, no peritoneal signs. Labs per triage reviewed, notable for elevated lipase at 123, otherwise unremarkable. No leukocytosis. No anemia. Renal function and LFTs WNL. UA without signs of infection. Given elevated lipase will obtain RUQ abdominal US. GI cocktail/pepcid/fluids and re-eval.   RUQ ultrasound with gallbladder wall thickening. No gallstone. No biliary ductal dilatation. Findings are nonspecific and may represent cholecystitis, reactive changes due to local inflammation, heart failure, or hypoproteinemia. Patient is afebrile, no  leukocytosis, negative murphys, feel acute cholecystitis is less likely at this current time.   17:30:RE-EVAL:Pain fairly unchanged. Discussed with supervising physician Dr. Jeraldine Loots- will obtain CT abdomen pelvis and administer fluids, fentanyl, and zofran.   CT abdomen/pelvis- Mild transmural thickening of the rectum with mild perirectal soft tissue induration question proctitis. 2. Increased colonic stool burden.  No bowel obstruction.-- patient without rectal complaints, UA without infection, all discomfort is in the upper abdomen, doubt proctitis based on clinical presentation.   Feel likely acute pancreatitis without complicating factors at this time- discussed admission with the patient which he is refusing, risks/benefits/alternatives discussed and he would really like to go home. He appears hemodynamically stable, will discharge home with clear liquid diet, percocet, and zofran with GI follow up. [employer] [place] Controlled Substance  reporting System queried. I discussed results, treatment plan, need for  follow-up, and strict return precautions with the patient at length. Provided opportunity for questions, patient confirmed understanding and is in agreement with plan.   Findings and plan of care discussed with supervising physician Dr. Jeraldine Loots who personally evaluated and examined this patient and is in agreement with plan.   Final Clinical Impressions(s) / ED Diagnoses   Final diagnoses:  Abdominal pain  Acute pancreatitis without infection or necrosis, unspecified pancreatitis type    ED Discharge Orders        Ordered    ondansetron (ZOFRAN ODT) 4 MG disintegrating tablet  Every 8 hours PRN     12/24/17 2222    oxyCODONE-acetaminophen (PERCOCET/ROXICET) 5-325 MG tablet  Every 6 hours PRN     12/24/17 2222       Cherly Anderson, PA-C 12/24/17 2346    Gerhard Munch, MD 12/24/17 2357

## 2018-01-10 ENCOUNTER — Other Ambulatory Visit: Payer: Self-pay

## 2018-01-10 ENCOUNTER — Inpatient Hospital Stay (HOSPITAL_COMMUNITY): Payer: Self-pay

## 2018-01-10 ENCOUNTER — Emergency Department (HOSPITAL_COMMUNITY)
Admission: EM | Admit: 2018-01-10 | Discharge: 2018-01-10 | Disposition: A | Discharge status: Home / Self Care | Payer: Self-pay | Attending: Emergency Medicine | Admitting: Emergency Medicine

## 2018-01-10 ENCOUNTER — Encounter (HOSPITAL_COMMUNITY): Payer: Self-pay | Admitting: Emergency Medicine

## 2018-01-10 DIAGNOSIS — R10811 Right upper quadrant abdominal tenderness: Secondary | ICD-10-CM

## 2018-01-10 DIAGNOSIS — R112 Nausea with vomiting, unspecified: Secondary | ICD-10-CM | POA: Insufficient documentation

## 2018-01-10 DIAGNOSIS — R1011 Right upper quadrant pain: Secondary | ICD-10-CM | POA: Insufficient documentation

## 2018-01-10 DIAGNOSIS — R109 Unspecified abdominal pain: Secondary | ICD-10-CM

## 2018-01-10 DIAGNOSIS — F17210 Nicotine dependence, cigarettes, uncomplicated: Secondary | ICD-10-CM | POA: Insufficient documentation

## 2018-01-10 LAB — URINALYSIS, ROUTINE W REFLEX MICROSCOPIC
Bilirubin Urine: NEGATIVE
GLUCOSE, UA: NEGATIVE mg/dL
Hgb urine dipstick: NEGATIVE
Ketones, ur: NEGATIVE mg/dL
LEUKOCYTES UA: NEGATIVE
Nitrite: NEGATIVE
PROTEIN: NEGATIVE mg/dL
SPECIFIC GRAVITY, URINE: 1.02 (ref 1.005–1.030)
pH: 6 (ref 5.0–8.0)

## 2018-01-10 LAB — CBC
HEMATOCRIT: 43.9 % (ref 39.0–52.0)
HEMOGLOBIN: 15.3 g/dL (ref 13.0–17.0)
MCH: 32.3 pg (ref 26.0–34.0)
MCHC: 34.9 g/dL (ref 30.0–36.0)
MCV: 92.6 fL (ref 78.0–100.0)
PLATELETS: 160 10*3/uL (ref 150–400)
RBC: 4.74 MIL/uL (ref 4.22–5.81)
RDW: 12 % (ref 11.5–15.5)
WBC: 6.3 10*3/uL (ref 4.0–10.5)

## 2018-01-10 LAB — COMPREHENSIVE METABOLIC PANEL
ALT: 21 U/L (ref 0–44)
ANION GAP: 7 (ref 5–15)
AST: 23 U/L (ref 15–41)
Albumin: 4.2 g/dL (ref 3.5–5.0)
Alkaline Phosphatase: 58 U/L (ref 38–126)
BUN: 11 mg/dL (ref 6–20)
CHLORIDE: 108 mmol/L (ref 98–111)
CO2: 25 mmol/L (ref 22–32)
Calcium: 9.4 mg/dL (ref 8.9–10.3)
Creatinine, Ser: 1.1 mg/dL (ref 0.61–1.24)
Glucose, Bld: 96 mg/dL (ref 70–99)
POTASSIUM: 4 mmol/L (ref 3.5–5.1)
Sodium: 140 mmol/L (ref 135–145)
Total Bilirubin: 0.6 mg/dL (ref 0.3–1.2)
Total Protein: 7 g/dL (ref 6.5–8.1)

## 2018-01-10 LAB — LIPASE, BLOOD: LIPASE: 89 U/L — AB (ref 11–51)

## 2018-01-10 MED ORDER — SUCRALFATE 1 G PO TABS: 1.0000 g | ORAL_TABLET | Freq: Three times a day (TID) | ORAL | 0 refills | Status: DC

## 2018-01-10 MED ORDER — ONDANSETRON 4 MG PO TBDP
4.0000 mg | ORAL_TABLET | Freq: Once | ORAL | Status: AC
  Administered 2018-01-10: 4 mg via ORAL
  Filled 2018-01-10: qty 1

## 2018-01-10 MED ORDER — PANTOPRAZOLE SODIUM 20 MG PO TBEC: 20.0000 mg | DELAYED_RELEASE_TABLET | Freq: Every day | ORAL | 0 refills | Status: DC

## 2018-01-10 MED ORDER — KETOROLAC TROMETHAMINE 15 MG/ML IJ SOLN
15.0000 mg | Freq: Once | INTRAMUSCULAR | Status: AC
  Administered 2018-01-10: 15 mg via INTRAVENOUS
  Filled 2018-01-10: qty 1

## 2018-01-10 MED ORDER — PANTOPRAZOLE SODIUM 40 MG PO TBEC
40.0000 mg | DELAYED_RELEASE_TABLET | Freq: Once | ORAL | Status: AC
  Administered 2018-01-10: 40 mg via ORAL
  Filled 2018-01-10: qty 1

## 2018-01-10 MED ORDER — SODIUM CHLORIDE 0.9 % IV BOLUS
500.0000 mL | Freq: Once | INTRAVENOUS | Status: AC
  Administered 2018-01-10: 500 mL via INTRAVENOUS

## 2018-01-10 MED ORDER — GI COCKTAIL ~~LOC~~
30.0000 mL | Freq: Once | ORAL | Status: AC
  Administered 2018-01-10: 30 mL via ORAL
  Filled 2018-01-10: qty 30

## 2018-01-10 NOTE — ED Provider Notes (Signed)
[facility] EMERGENCY DEPARTMENT Provider Note   CSN: [id number] Arrival date & time: 01/10/18  4098     History   Chief Complaint Chief Complaint  Patient presents with  . Abdominal Pain    HPI Drew Simmons is a 27 y.o. male.  HPI   Pt is a 27 y/o male who presents to the ED today c/o epigastric abdominal pain that has been ongoing intermittently for several years, but seemed to worsen about 2 weeks ago. Pt rates his pain 8-9/10. Describes pain as constant, achy pain with intermittent sharp pains. Pain radiates to his back. He reports associated nausea, vomiting (x1), and heartburn. Denies diarrhea, constipation, shortness of breath, dysuria, urinary frequency, urgency, hematuria, blood in stool.  He was seen in the ED on 12/24/17 with similar sxs and had an elevated lipase. He was diagnosed with likely uncomplicated pancreatitis and was discharged with zofran and percocet. He states that he took these medications and they improved his sxs. He states he took percocet more than prescribed. After he ran out of medications his sxs returned.   States he thinks he was diagnosed with peptic ulcer disease last year. He denies that he has had an EGD or f/u with GI. He does not take medication for this.   History reviewed. No pertinent past medical history.  There are no active problems to display for this patient.   History reviewed. No pertinent surgical history.      Home Medications    Prior to Admission medications   Medication Sig Start Date End Date Taking? Authorizing Provider  ondansetron (ZOFRAN ODT) 4 MG disintegrating tablet Take 1 tablet (4 mg total) by mouth every 8 (eight) hours as needed for nausea or vomiting. 12/24/17   Petrucelli, Lelon Mast R, PA-C  oxyCODONE-acetaminophen (PERCOCET/ROXICET) 5-325 MG tablet Take 1-2 tablets by mouth every 6 (six) hours as needed for severe pain. 12/24/17   Petrucelli, Samantha R, PA-C  pantoprazole (PROTONIX) 20 MG  tablet Take 1 tablet (20 mg total) by mouth daily for 14 days. 01/10/18 01/24/18  Kebra Lowrimore S, PA-C  sucralfate (CARAFATE) 1 g tablet Take 1 tablet (1 g total) by mouth 3 (three) times daily with meals for 14 days. 01/10/18 01/24/18  Antwain Caliendo S, PA-C    Family History No family history on file.  Social History Social History   Tobacco Use  . Smoking status: Current Every Day Smoker    Types: Cigarettes  . Smokeless tobacco: Never Used  Substance Use Topics  . Alcohol use: Yes    Comment: occ  . Drug use: Yes    Types: Marijuana     Allergies   Patient has no known allergies.   Review of Systems Review of Systems  Constitutional: Negative for fever.  HENT: Negative for congestion.   Eyes: Negative for visual disturbance.  Respiratory: Negative for shortness of breath.   Cardiovascular: Negative for chest pain.  Gastrointestinal: Positive for abdominal pain, nausea and vomiting. Negative for blood in stool, constipation and diarrhea.  Genitourinary: Negative for dysuria, frequency, hematuria and urgency.  Musculoskeletal: Positive for back pain.  Skin: Negative for wound.  Neurological: Negative for headaches.     Physical Exam Updated Vital Signs BP 138/74   Pulse 64   Temp 98.2 F (36.8 C) (Oral)   Resp 16   Ht 5\' 8"  (1.727 m)   Wt 77.1 kg (170 lb)   SpO2 100%   BMI 25.85 kg/m   Physical Exam  Constitutional:  He appears well-developed and well-nourished.  Pt is texting on phone during history in exam in NAD.   HENT:  Head: Normocephalic and atraumatic.  Eyes: Conjunctivae are normal.  Neck: Neck supple.  Cardiovascular: Normal rate and regular rhythm.  No murmur heard. Pulmonary/Chest: Effort normal and breath sounds normal. No respiratory distress.  Abdominal: Soft. There is no rebound, no guarding and no tenderness at McBurney's point.  Mild epigastric and RUQ TTP that seems to improve with distraction  Musculoskeletal: He exhibits no  edema.  Neurological: He is alert.  Skin: Skin is warm and dry. Capillary refill takes less than 2 seconds.  Psychiatric: He has a normal mood and affect.  Nursing note and vitals reviewed.  ED Treatments / Results  Labs (all labs ordered are listed, but only abnormal results are displayed) Labs Reviewed  LIPASE, BLOOD - Abnormal; Notable for the following components:      Result Value   Lipase 89 (*)    All other components within normal limits  COMPREHENSIVE METABOLIC PANEL  CBC  URINALYSIS, ROUTINE W REFLEX MICROSCOPIC    EKG None  Radiology US Abdomen Limited Ruq  Result Date: 01/10/2018 CLINICAL DATA:  27 year old male with RIGHT UPPER quadrant abdominal pain for several weeks. EXAM: ULTRASOUND ABDOMEN LIMITED RIGHT UPPER QUADRANT COMPARISON:  12/24/2017 ultrasound FINDINGS: Gallbladder: Gallbladder is unremarkable. There is no evidence of cholelithiasis or acute cholecystitis. Common bile duct: Diameter: 1.2 mm. No intrahepatic or extrahepatic biliary dilatation. Liver: No focal lesion identified. Within normal limits in parenchymal echogenicity. Portal vein is patent on color Doppler imaging with normal direction of blood flow towards the liver. IMPRESSION: Normal RIGHT UPPER quadrant abdominal ultrasound. Electronically Signed   By: Harmon Pier M.D.   On: 01/10/2018 13:45    Procedures Procedures (including critical care time)  Medications Ordered in ED Medications  pantoprazole (PROTONIX) EC tablet 40 mg (40 mg Oral Given 01/10/18 1248)  gi cocktail (Maalox,Lidocaine,Donnatal) (30 mLs Oral Given 01/10/18 1248)  ondansetron (ZOFRAN-ODT) disintegrating tablet 4 mg (4 mg Oral Given 01/10/18 1247)  ketorolac (TORADOL) 15 MG/ML injection 15 mg (15 mg Intravenous Given 01/10/18 1306)  sodium chloride 0.9 % bolus 500 mL (0 mLs Intravenous Stopped 01/10/18 1416)    Initial Impression / Assessment and Plan / ED Course  I have reviewed the triage vital signs and the nursing  notes.  Pertinent labs & imaging results that were available during my care of the patient were reviewed by me and considered in my medical decision making (see chart for details).     Final Clinical Impressions(s) / ED Diagnoses   Final diagnoses:  RUQ abdominal tenderness  Abdominal pain, unspecified abdominal location   Patient presenting with epigastric and right upper quadrant abdominal tenderness that has been ongoing intermittently for several years but seem to worsen over the last 2 weeks.  Was recently worked up for this and diagnosed with acute on complicated pancreatitis.  His lipase today has improved.  The remainder of his labs are within normal limits.  He has no evidence of UTI on his UA.  No hematuria.  Repeat right upper quadrant ultrasound showed no evidence of cholecystitis.  His symptoms improved after Protonix, Zofran and a GI cocktail.  He is able to tolerate p.o. in the ED.  Suspect that his symptoms are either related to his recent diagnosis of pancreatitis or possible peptic ulcer disease.  We will give him Rx for Protonix and Carafate since his symptoms improved with these medications today.  Do not suspect under other acute intra-abdominal process today that would require further work-up or admission to the hospital.  Nonseptic appearing.  Nonsurgical abdomen on repeat eval.  Patient given referral to go for GI as he states that [facility] did not take his insurance.  Also given information for PCP to follow-up with.  Advised on strict return precautions for any new or worsening symptoms.  Patient voiced an understanding of plan reasons to return immediately to the ED.  All questions answered.  ED Discharge Orders        Ordered    pantoprazole (PROTONIX) 20 MG tablet  Daily     01/10/18 1429    sucralfate (CARAFATE) 1 g tablet  3 times daily with meals     01/10/18 1429       Leah Thornberry S, PA-C 01/10/18 1431    Bethann BerkshireZammit, Joseph, MD 01/10/18 1810

## 2018-01-10 NOTE — ED Triage Notes (Signed)
Patient complains of abdominal pain for the last few weeks. States he has been at another ED previously and was referred to a GI specialist, states he was unable to be seen there because they did not accept his insurance. Patient reports waking up at night with burning pain and feeling like he might vomit. Patient alert, oriented, and in no apparent distress at this time.  Patient states he has taken PPI's and Tums with relief, but reports relief stops when he stops taking the medication.

## 2018-01-10 NOTE — Discharge Instructions (Addendum)
These take the medications as prescribed.  Please follow-up with the gastroenterology doctor to make an appointment for follow-up.  Please follow up with your primary doctor within the next 5-7 days.  If you do not have a primary care provider, information for a healthcare clinic has been provided for you to make arrangements for follow up care. Please return to the ER sooner if you have any new or worsening symptoms, or if you have any of the following symptoms:  Abdominal pain that does not go away.  You have a fever.  You keep throwing up (vomiting).  The pain is felt only in portions of the abdomen. Pain in the right side could possibly be appendicitis. In an adult, pain in the left lower portion of the abdomen could be colitis or diverticulitis.  You pass bloody or black tarry stools.  There is bright red blood in the stool.  The constipation stays for more than 4 days.  There is belly (abdominal) or rectal pain.  You do not seem to be getting better.  You have any questions or concerns.

## 2018-01-10 NOTE — ED Notes (Signed)
ED Provider at bedside. 

## 2018-05-12 ENCOUNTER — Encounter (HOSPITAL_COMMUNITY): Payer: Self-pay

## 2018-05-12 ENCOUNTER — Emergency Department (HOSPITAL_COMMUNITY)
Admission: EM | Admit: 2018-05-12 | Discharge: 2018-05-12 | Disposition: A | Discharge status: Home / Self Care | Payer: Medicaid Other | Attending: Emergency Medicine | Admitting: Emergency Medicine

## 2018-05-12 ENCOUNTER — Other Ambulatory Visit: Payer: Self-pay

## 2018-05-12 DIAGNOSIS — K047 Periapical abscess without sinus: Secondary | ICD-10-CM | POA: Insufficient documentation

## 2018-05-12 DIAGNOSIS — F17210 Nicotine dependence, cigarettes, uncomplicated: Secondary | ICD-10-CM | POA: Insufficient documentation

## 2018-05-12 DIAGNOSIS — K029 Dental caries, unspecified: Secondary | ICD-10-CM | POA: Insufficient documentation

## 2018-05-12 MED ORDER — AMOXICILLIN 500 MG PO CAPS: 500.0000 mg | ORAL_CAPSULE | Freq: Three times a day (TID) | ORAL | 0 refills | Status: DC

## 2018-05-12 NOTE — Discharge Instructions (Addendum)
Take tylenol and Advil for pain in addition to the medication we give you. Follow up with a dentist as soon as possible.

## 2018-05-12 NOTE — ED Triage Notes (Signed)
He c/o back upper left toothache x 2 days which is sensitive to air exposure. He is in no distress. He states he has never had a wisdom tooth extraction.

## 2018-05-12 NOTE — ED Provider Notes (Signed)
[facility]-EMERGENCY DEPT Provider Note   CSN: [id number] Arrival date & time: 05/12/18  1051     History   Chief Complaint Chief Complaint  Patient presents with  . Dental Pain    HPI Drew Simmons is a 27 y.o. male who presents to the ED with dental pain. The pain is located in the upper left dental area. The pain started 2 days ago and has gotten worse.   The history is provided by the patient. No language interpreter was used.  Dental Pain   This is a new problem. The current episode started 2 days ago. The problem occurs constantly. The problem has been gradually worsening. The pain is moderate.    No past medical history on file.  There are no active problems to display for this patient.   No past surgical history on file.      Home Medications    Prior to Admission medications   Medication Sig Start Date End Date Taking? Authorizing Provider  amoxicillin (AMOXIL) 500 MG capsule Take 1 capsule (500 mg total) by mouth 3 (three) times daily. 05/12/18   Janne NapoleonNeese, Marshay Slates M, NP  ondansetron (ZOFRAN ODT) 4 MG disintegrating tablet Take 1 tablet (4 mg total) by mouth every 8 (eight) hours as needed for nausea or vomiting. 12/24/17   Petrucelli, Lelon MastSamantha R, PA-C  oxyCODONE-acetaminophen (PERCOCET/ROXICET) 5-325 MG tablet Take 1-2 tablets by mouth every 6 (six) hours as needed for severe pain. 12/24/17   Petrucelli, Samantha R, PA-C  pantoprazole (PROTONIX) 20 MG tablet Take 1 tablet (20 mg total) by mouth daily for 14 days. 01/10/18 01/24/18  Couture, Cortni S, PA-C  sucralfate (CARAFATE) 1 g tablet Take 1 tablet (1 g total) by mouth 3 (three) times daily with meals for 14 days. 01/10/18 01/24/18  Couture, Cortni S, PA-C    Family History History reviewed. No pertinent family history.  Social History Social History   Tobacco Use  . Smoking status: Current Every Day Smoker    Types: Cigarettes  . Smokeless tobacco: Never Used  Substance Use Topics  .  Alcohol use: Yes    Comment: occ  . Drug use: Yes    Types: Marijuana     Allergies   Patient has no known allergies.   Review of Systems Review of Systems  Constitutional: Negative for chills and fever.  HENT: Positive for dental problem and ear pain. Negative for sore throat and trouble swallowing.   Hematological: Positive for adenopathy.  All other systems reviewed and are negative.    Physical Exam Updated Vital Signs BP 121/79 (BP Location: Left Arm)   Pulse 85   Temp 98 F (36.7 C) (Oral)   SpO2 100%   Physical Exam  Constitutional: He appears well-developed and well-nourished. No distress.  HENT:  Head: Normocephalic.  Right Ear: Tympanic membrane normal.  Left Ear: Tympanic membrane normal.  Nose: Nose normal.  Mouth/Throat: Oropharynx is clear and moist. Abnormal dentition.  The gum surrounding the left upper second molar is red and swollen. Tender on exam. The 3rd molar is partially erupted.   Eyes: Conjunctivae and EOM are normal.  Neck: Neck supple.  Cardiovascular: Normal rate.  Pulmonary/Chest: Effort normal.  Abdominal: There is no tenderness.  Musculoskeletal: Normal range of motion.  Lymphadenopathy:    He has cervical adenopathy.  Neurological: He is alert.  Skin: Skin is warm and dry.  Psychiatric: He has a normal mood and affect.  Nursing note and vitals reviewed.  ED Treatments / Results  Labs (all labs ordered are listed, but only abnormal results are displayed) Labs Reviewed - No data to display Radiology No results found.  Procedures Procedures (including critical care time)  Medications Ordered in ED Medications - No data to display   Initial Impression / Assessment and Plan / ED Course  I have reviewed the triage vital signs and the nursing notes. Patient with toothache.  No gross abscess.  Exam unconcerning for Ludwig's angina or spread of infection.  Will treat with amoxicillin and anti-inflammatories medicine.  Urged  patient to follow-up with dentist.    Final Clinical Impressions(s) / ED Diagnoses   Final diagnoses:  Infected dental caries    ED Discharge Orders         Ordered    amoxicillin (AMOXIL) 500 MG capsule  3 times daily     05/12/18 1223           Kerrie Buffalo [place], [place] 05/12/18 1227    Maia Plan, MD 05/12/18 1504

## 2018-11-04 IMAGING — CT CT ABD-PELV W/ CM
2 of 4 series · 16 of 46 positions shown, 18 images · IV contrast (ISOVUE)
Comparison: None.

CLINICAL DATA: Abdominal pain. History of ulcers. Nausea and dry
heaving.

EXAM:
CT ABDOMEN AND PELVIS WITH CONTRAST
TECHNIQUE: Multidetector CT imaging of the abdomen and pelvis was performed
using the standard protocol following bolus administration of
intravenous contrast.
CONTRAST:  100mL QAHU0C-3SS IOPAMIDOL (QAHU0C-3SS) INJECTION 61%

[Series 2: axial st · axial · 0.78mm/px · z∈[-676,-276]mm · 13 of 92 slices shown, 15 images]
[im 6/92  soft-tissue]
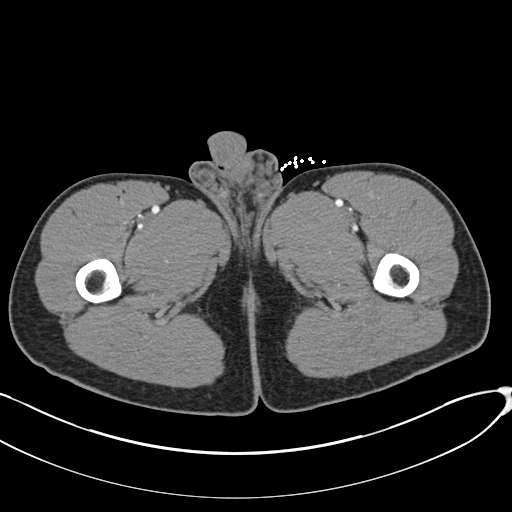
[im 6/92  bone]
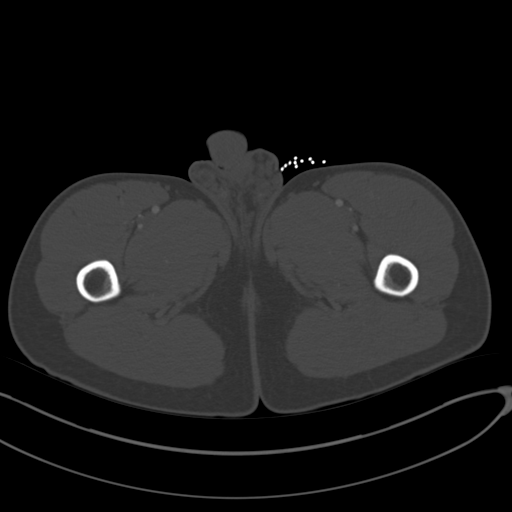
[im 11/92  soft-tissue]
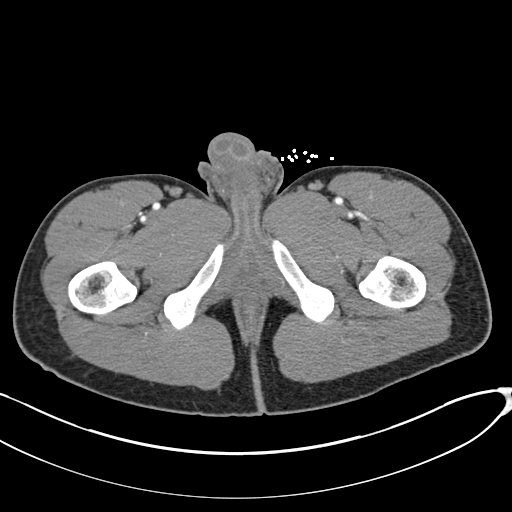
[im 22/92  soft-tissue]
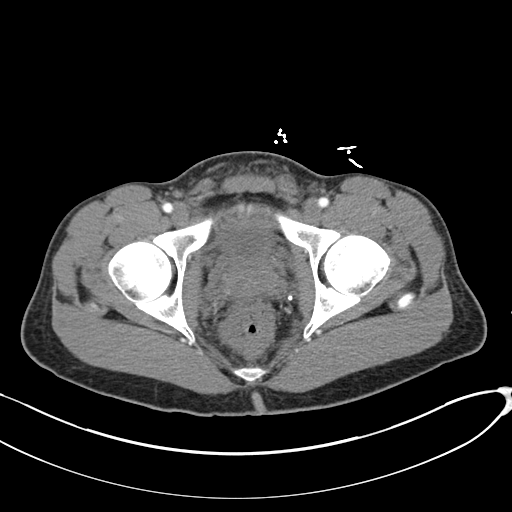
[im 27/92  soft-tissue]
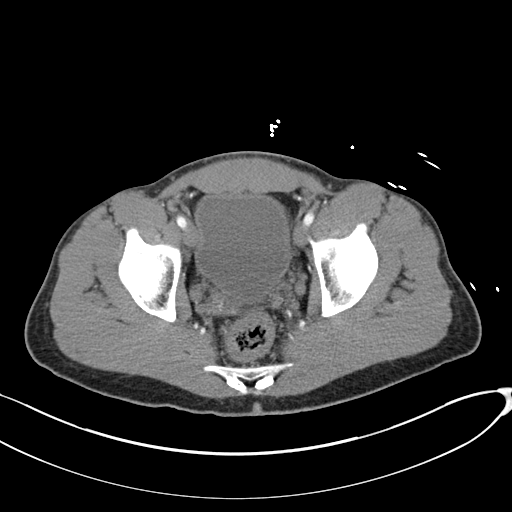
[im 33/92  soft-tissue]
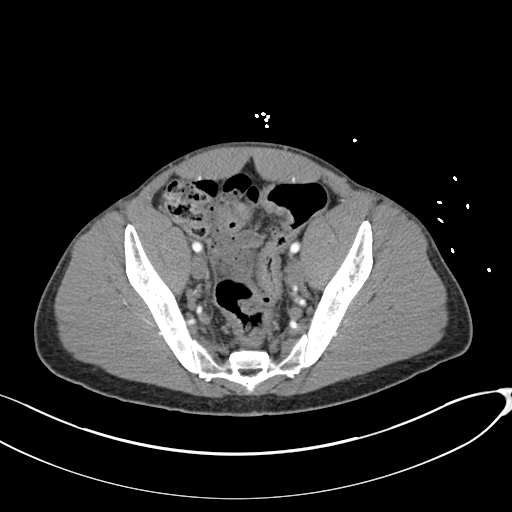
[im 38/92  soft-tissue]
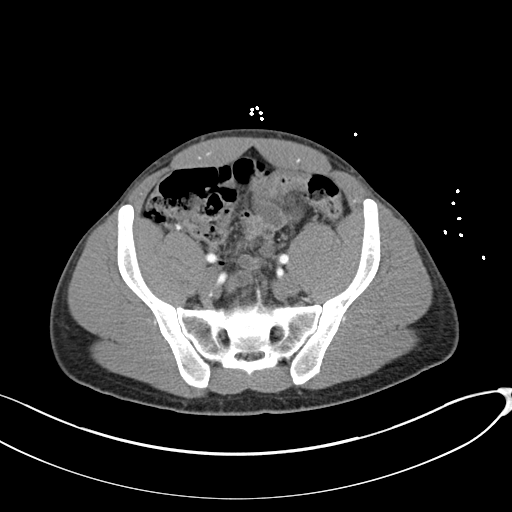
[im 49/92  soft-tissue]
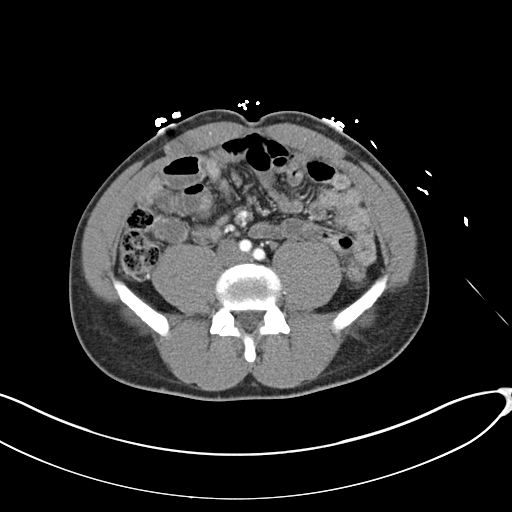
[im 54/92  soft-tissue]
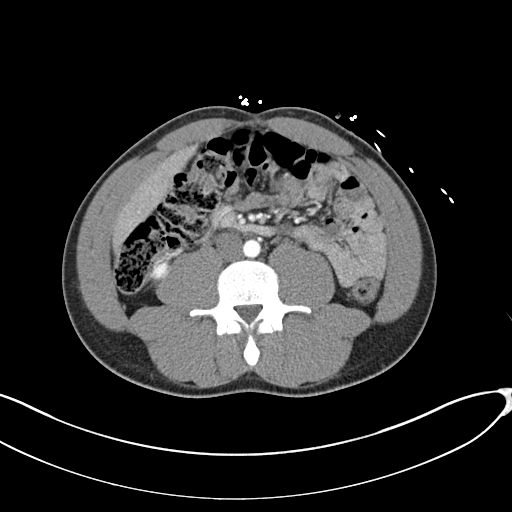
[im 59/92  soft-tissue]
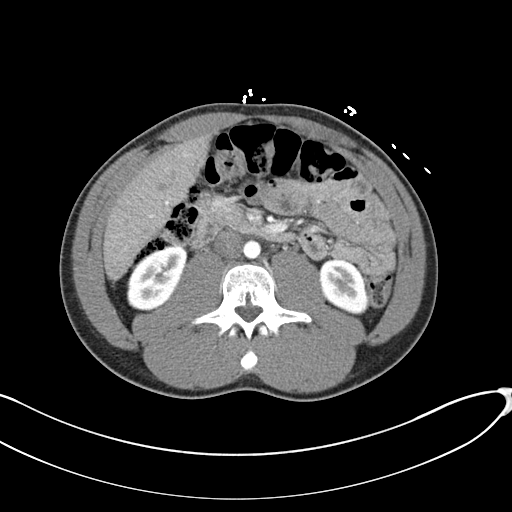
[im 59/92  bone]
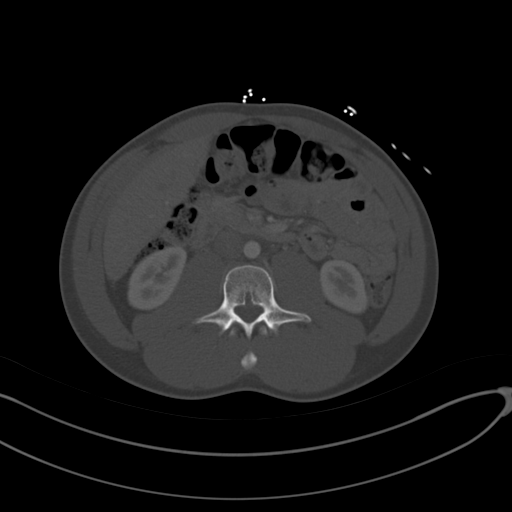
[im 65/92  soft-tissue]
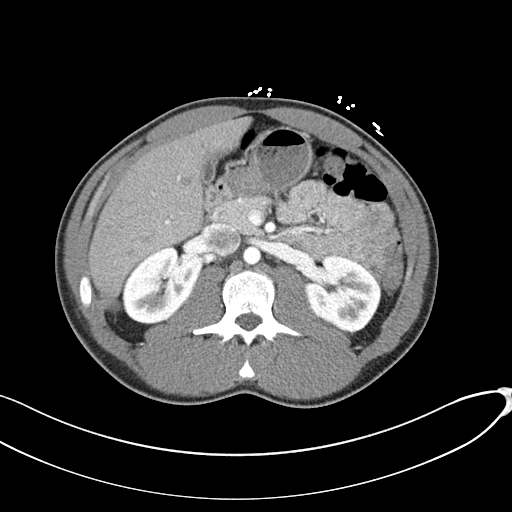
[im 70/92  soft-tissue]
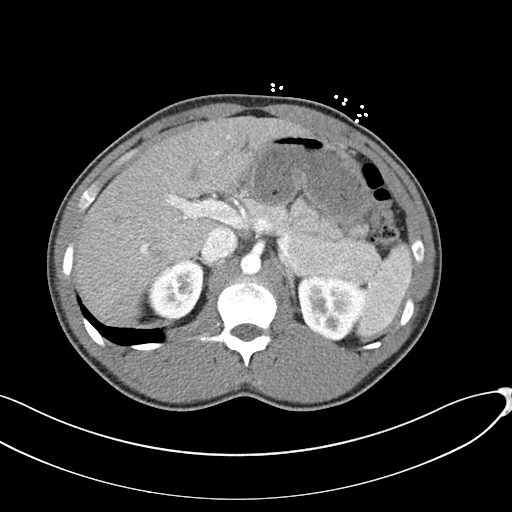
[im 81/92  soft-tissue]
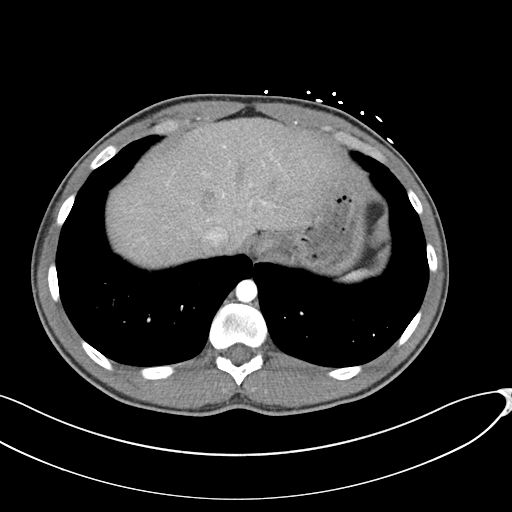
[im 86/92  soft-tissue]
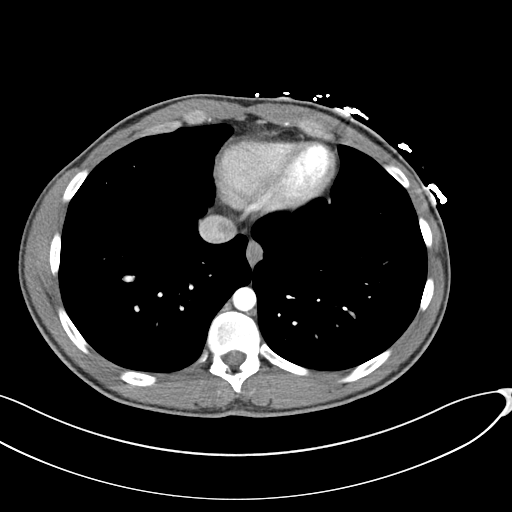

[Series 4: coronal st · coronal · 0.76mm/px · 3 of 100 slices shown]
[im 34/100  soft-tissue]
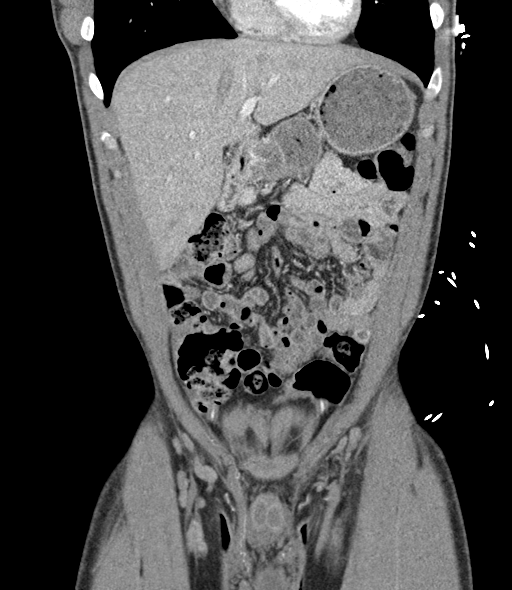
[im 45/100  soft-tissue]
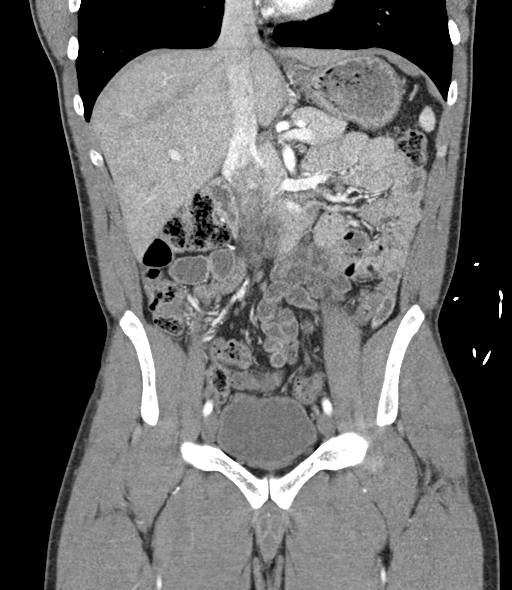
[im 56/100  soft-tissue]
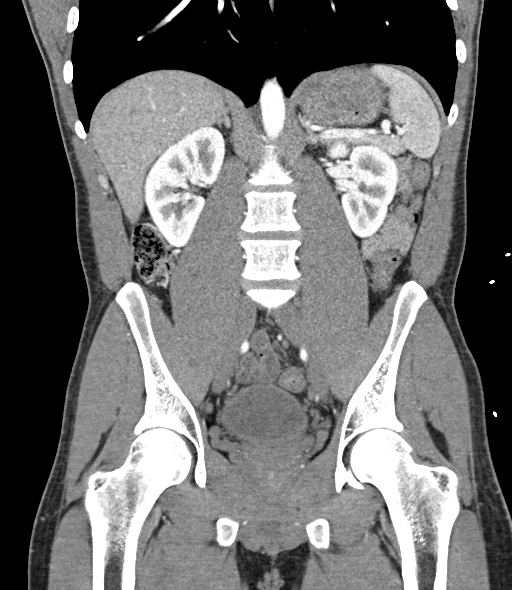

[16 of 46 positions shown; findings below may reference images not displayed]

FINDINGS: Lower chest: Normal size heart without pericardial effusion. Clear
lung bases.

Hepatobiliary: Normal

Pancreas: No mass, ductal dilatation or inflammation of the
pancreas.

Spleen: Normal size spleen without acute abnormality.

Adrenals/Urinary Tract: Adrenal glands are unremarkable. Kidneys are
normal, without renal calculi, focal lesion, or hydronephrosis.
Bladder is unremarkable.

Stomach/Bowel: Stomach is within normal limits. Appendix appears
normal. There is mild circumferential perirectal thickening with
slight perirectal fatty induration. Proctitis might have this
appearance. Remainder of the small and large intestine are
unremarkable. Moderate distention of the stomach with ingested food.
Moderate stool burden within the right colon.

Vascular/Lymphatic: No significant vascular findings are present. No
enlarged abdominal or pelvic lymph nodes.

Reproductive: Prostate is unremarkable.

Other: No abdominal wall hernia or abnormality. No abdominopelvic
ascites.

Musculoskeletal: No acute or significant osseous findings.
IMPRESSION: 1. Mild transmural thickening of the rectum with mild perirectal
soft tissue induration question proctitis.
2. Increased colonic stool burden.  No bowel obstruction.

## 2019-04-02 IMAGING — US US ABDOMEN LIMITED
1 series · 14 of 25 positions shown · non-contrast
Comparison: 12/24/2017 ultrasound

CLINICAL DATA: 27-year-old male with RIGHT UPPER quadrant abdominal
pain for several weeks.

EXAM:
ULTRASOUND ABDOMEN LIMITED RIGHT UPPER QUADRANT

[Series 1: us abdomen limited · 0.17mm/px · 14 of 54 slices shown]
[im 1/54]
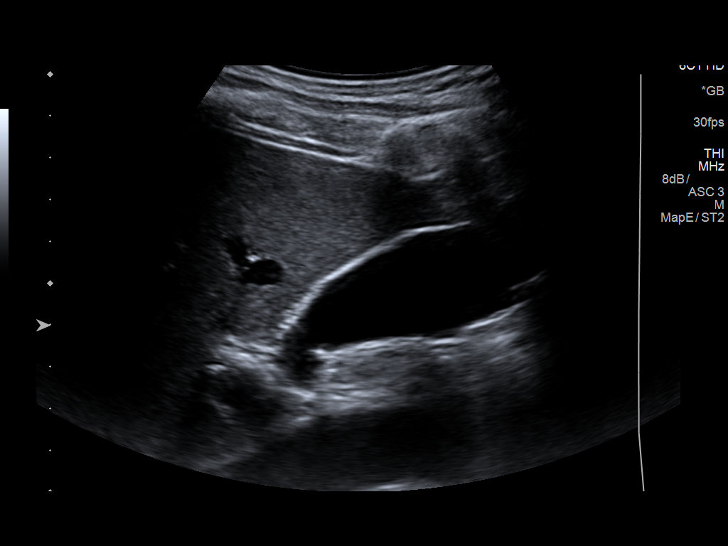
[im 5/54]
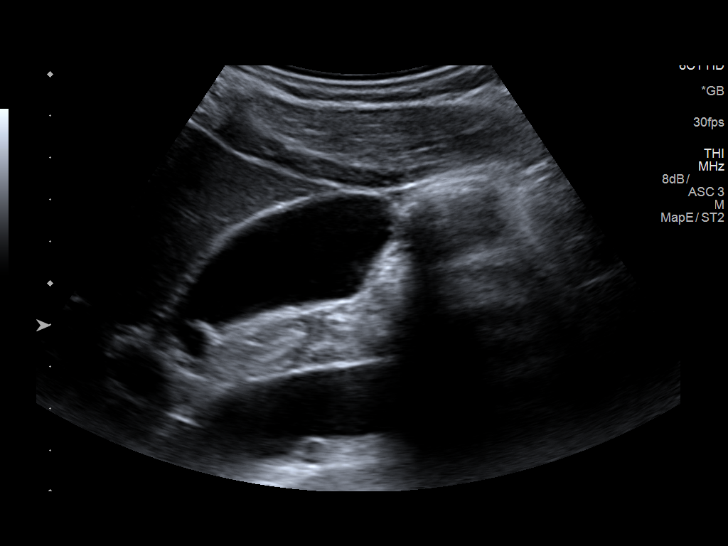
[im 9/54]
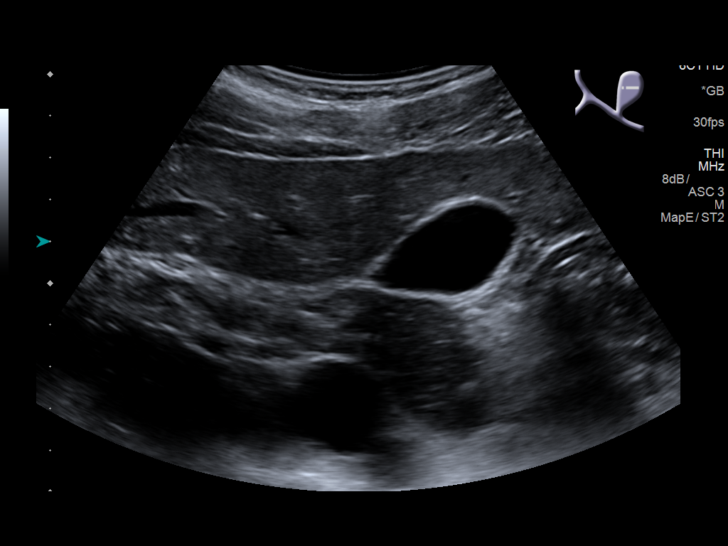
[im 14/54]
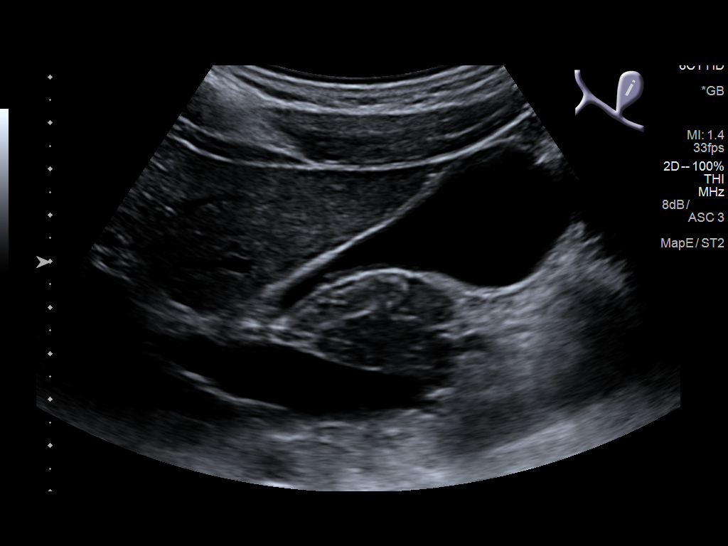
[im 18/54]
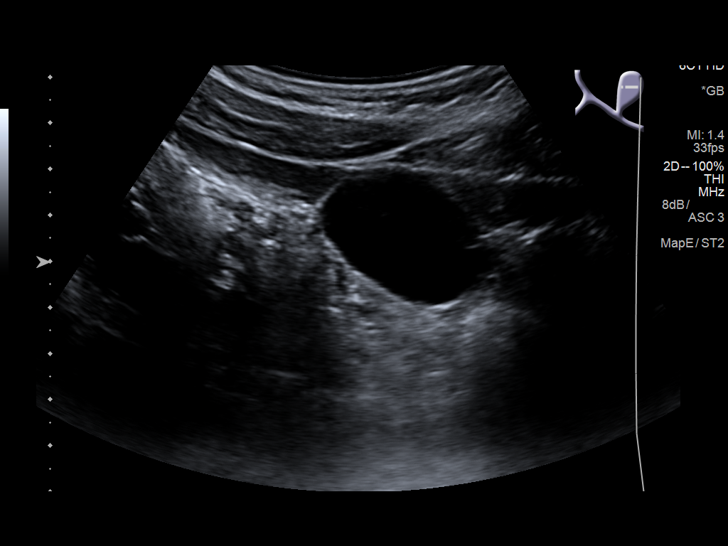
[im 20/54]
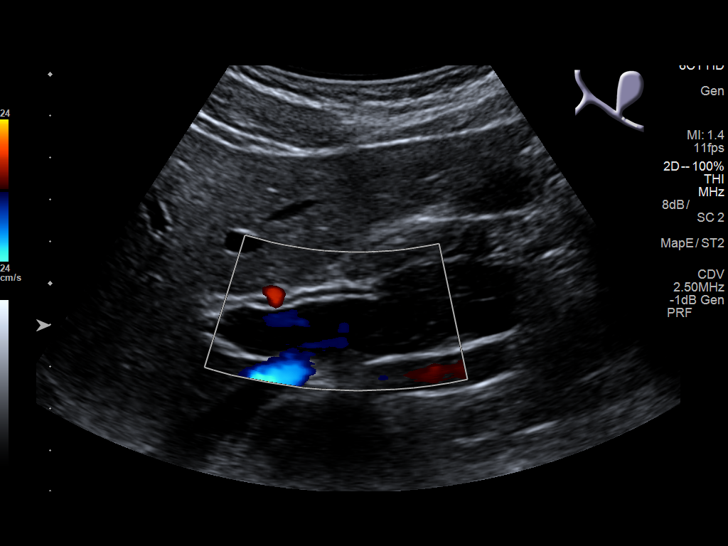
[im 25/54]
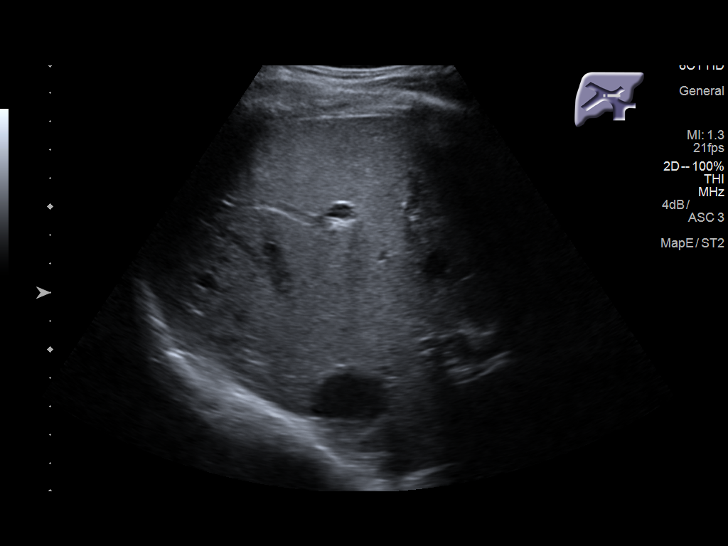
[im 29/54]
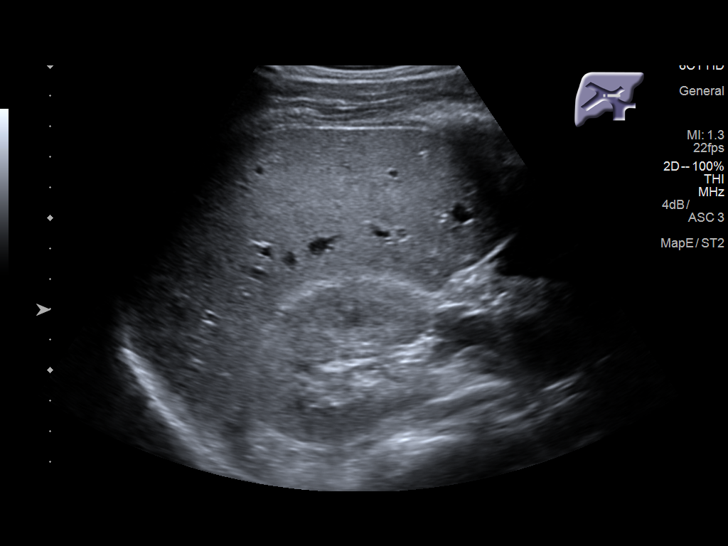
[im 34/54]
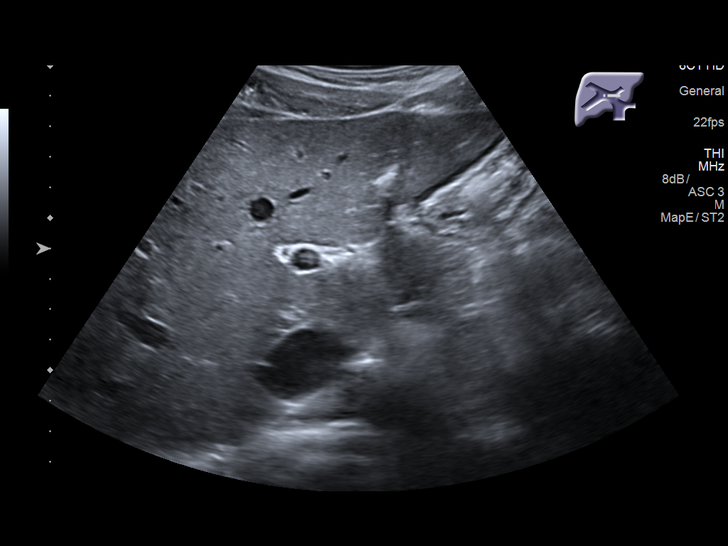
[im 36/54]
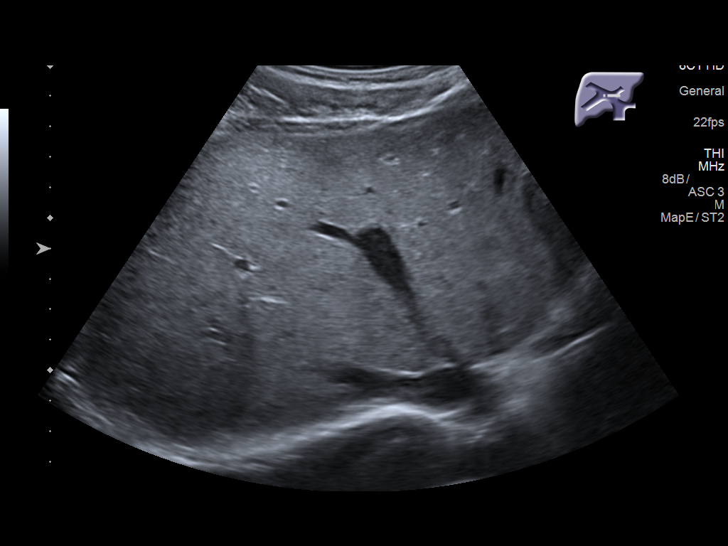
[im 40/54]
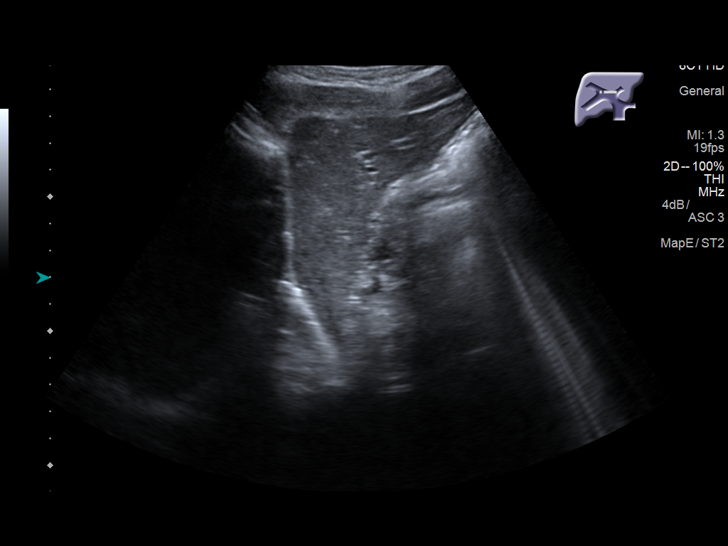
[im 45/54]
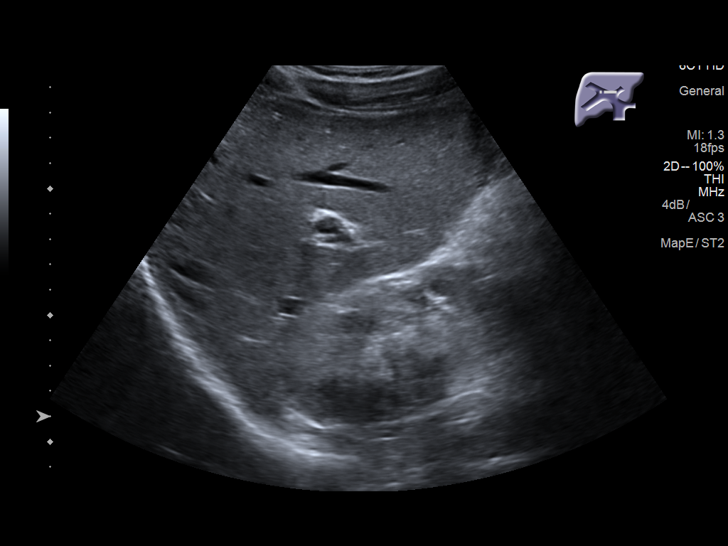
[im 49/54]
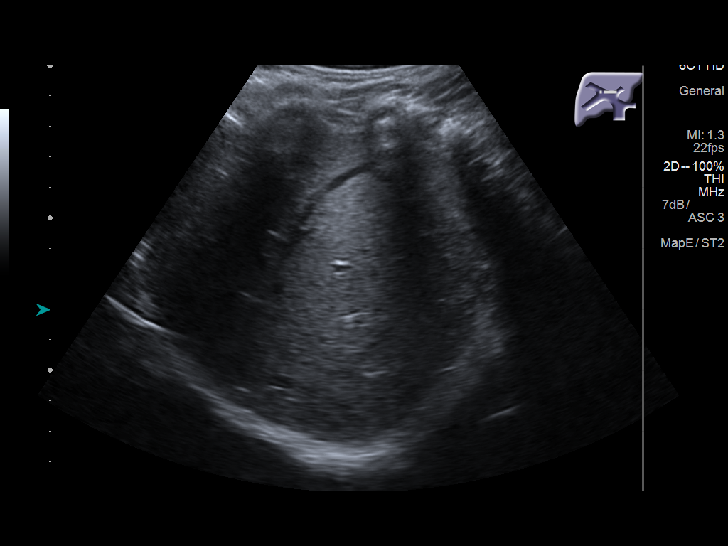
[im 54/54]
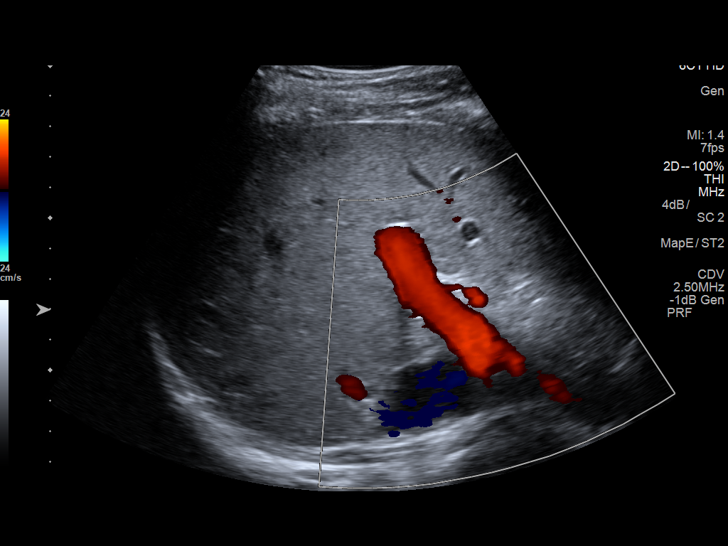

[14 of 25 positions shown; findings below may reference images not displayed]

FINDINGS: Gallbladder:

Gallbladder is unremarkable. There is no evidence of cholelithiasis
or acute cholecystitis.

Common bile duct:

Diameter: 1.2 mm. No intrahepatic or extrahepatic biliary
dilatation.

Liver:

No focal lesion identified. Within normal limits in parenchymal
echogenicity. Portal vein is patent on color Doppler imaging with
normal direction of blood flow towards the liver.
IMPRESSION: Normal RIGHT UPPER quadrant abdominal ultrasound.

## 2020-03-03 ENCOUNTER — Encounter (HOSPITAL_COMMUNITY): Payer: Self-pay | Admitting: Emergency Medicine

## 2020-03-03 ENCOUNTER — Emergency Department (HOSPITAL_COMMUNITY)
Admission: EM | Admit: 2020-03-03 | Discharge: 2020-03-03 | Disposition: A | Discharge status: Home / Self Care | Payer: Medicaid Other | Attending: Emergency Medicine | Admitting: Emergency Medicine

## 2020-03-03 ENCOUNTER — Other Ambulatory Visit: Payer: Self-pay

## 2020-03-03 DIAGNOSIS — K0889 Other specified disorders of teeth and supporting structures: Secondary | ICD-10-CM | POA: Insufficient documentation

## 2020-03-03 DIAGNOSIS — F17290 Nicotine dependence, other tobacco product, uncomplicated: Secondary | ICD-10-CM | POA: Insufficient documentation

## 2020-03-03 DIAGNOSIS — Z79899 Other long term (current) drug therapy: Secondary | ICD-10-CM | POA: Insufficient documentation

## 2020-03-03 MED ORDER — AMOXICILLIN-POT CLAVULANATE 875-125 MG PO TABS: 1.0000 | ORAL_TABLET | Freq: Two times a day (BID) | ORAL | 0 refills | Status: DC

## 2020-03-03 MED ORDER — NAPROXEN 500 MG PO TABS: 500.0000 mg | ORAL_TABLET | Freq: Two times a day (BID) | ORAL | 0 refills | Status: DC | PRN

## 2020-03-03 NOTE — ED Provider Notes (Signed)
[facility]-EMERGENCY DEPT Provider Note   CSN: [id number] Arrival date & time: 03/03/20  8366     History Chief Complaint  Patient presents with  . Dental Pain    Drew Simmons is a 29 y.o. male with a history of tobacco abuse who presents to the ED with complaints of L upper dental pain since yesterday.  Patient states he is having constant moderate to severe pain to the left upper molar area where he has a chipped tooth which has been chipped for a while now.  He has been trying ibuprofen without much relief.  He does not currently see a dentist.  He denies fever, chills, dysphagia, sore throat, vomiting, shortness of breath, or neck pain/stiffness.  HPI     History reviewed. No pertinent past medical history.  There are no problems to display for this patient.   History reviewed. No pertinent surgical history.     History reviewed. No pertinent family history.  Social History   Tobacco Use  . Smoking status: Current Every Day Smoker    Types: Cigars  . Smokeless tobacco: Never Used  Vaping Use  . Vaping Use: Never used  Substance Use Topics  . Alcohol use: Yes    Comment: occ  . Drug use: Yes    Types: Marijuana    Home Medications Prior to Admission medications   Medication Sig Start Date End Date Taking? Authorizing Provider  amoxicillin (AMOXIL) 500 MG capsule Take 1 capsule (500 mg total) by mouth 3 (three) times daily. 05/12/18   Janne Napoleon, NP  ondansetron (ZOFRAN ODT) 4 MG disintegrating tablet Take 1 tablet (4 mg total) by mouth every 8 (eight) hours as needed for nausea or vomiting. 12/24/17   Maliya Marich, Lelon Mast R, PA-C  oxyCODONE-acetaminophen (PERCOCET/ROXICET) 5-325 MG tablet Take 1-2 tablets by mouth every 6 (six) hours as needed for severe pain. 12/24/17   Antoniette Peake R, PA-C  pantoprazole (PROTONIX) 20 MG tablet Take 1 tablet (20 mg total) by mouth daily for 14 days. 01/10/18 01/24/18  Couture, Cortni S, PA-C    sucralfate (CARAFATE) 1 g tablet Take 1 tablet (1 g total) by mouth 3 (three) times daily with meals for 14 days. 01/10/18 01/24/18  Couture, Cortni S, PA-C    Allergies    Patient has no known allergies.  Review of Systems   Review of Systems  Constitutional: Negative for chills and fever.  HENT: Positive for dental problem. Negative for congestion, ear pain, sore throat and trouble swallowing.   Respiratory: Negative for shortness of breath.   Cardiovascular: Negative for chest pain.  Gastrointestinal: Negative for abdominal pain and vomiting.  Musculoskeletal: Negative for neck pain and neck stiffness.  Neurological: Negative for syncope.    Physical Exam Updated Vital Signs BP 132/90 (BP Location: Left Arm)   Pulse 90   Temp 98.8 F (37.1 C) (Oral)   Resp 17   Ht 5\' 10"  (1.778 m)   Wt 74.8 kg   SpO2 99%   BMI 23.68 kg/m   Physical Exam Vitals and nursing note reviewed.  Constitutional:      General: He is not in acute distress.    Appearance: He is well-developed. He is not toxic-appearing.  HENT:     Head: Normocephalic and atraumatic.     Right Ear: Tympanic membrane is not perforated, erythematous, retracted or bulging.     Left Ear: Tympanic membrane is not perforated, erythematous, retracted or bulging.     Nose: Nose  normal.     Mouth/Throat:     Pharynx: Uvula midline. No oropharyngeal exudate or posterior oropharyngeal erythema.      Comments: Posterior oropharynx is symmetric appearing. Patient tolerating own secretions without difficulty. No trismus. No drooling. No hot potato voice. No swelling beneath the tongue, submandibular compartment is soft.  Eyes:     General:        Right eye: No discharge.        Left eye: No discharge.     Conjunctiva/sclera: Conjunctivae normal.  Musculoskeletal:     Cervical back: Normal range of motion and neck supple. No edema, erythema, rigidity or crepitus. No pain with movement.  Lymphadenopathy:     Cervical: No  cervical adenopathy.  Neurological:     Mental Status: He is alert.  Psychiatric:        Behavior: Behavior normal.        Thought Content: Thought content normal.     ED Results / Procedures / Treatments   Labs (all labs ordered are listed, but only abnormal results are displayed) Labs Reviewed - No data to display  EKG None  Radiology No results found.  Procedures Procedures (including critical care time)  Medications Ordered in ED Medications - No data to display  ED Course  I have reviewed the triage vital signs and the nursing notes.  Pertinent labs & imaging results that were available during my care of the patient were reviewed by me and considered in my medical decision making (see chart for details).    MDM Rules/Calculators/A&P                         Patient presents with dental pain. Patient is nontoxic appearing, vitals without significant abnormality. No gross abscess.  Exam unconcerning for Ludwig's angina or deep space infection.  Will treat with Augmentin and Naproxen.  Urged patient to follow-up with dentist, dental resources were provided.  Discussed treatment plan and need for follow up as well as return precautions. Provided opportunity for questions, patient confirmed understanding and is agreeable to plan.  Final Clinical Impression(s) / ED Diagnoses Final diagnoses:  Pain, dental    Rx / DC Orders ED Discharge Orders         Ordered    naproxen (NAPROSYN) 500 MG tablet  2 times daily PRN        03/03/20 0523    amoxicillin-clavulanate (AUGMENTIN) 875-125 MG tablet  Every 12 hours        03/03/20 0523           Cherly Anderson, PA-C 03/03/20 0542    Melene Plan, DO 03/03/20 [phone]

## 2020-03-03 NOTE — Discharge Instructions (Signed)
Call one of the dentists offices provided to schedule an appointment for re-evaluation and further management within the next 48 hours.  ° °I have prescribed you Augmentin which is an antibiotic to treat the infection and Naproxen which is an anti-inflammatory medicine to treat the pain.  ° °Please take all of your antibiotics until finished. You may develop abdominal discomfort or diarrhea from the antibiotic.  You may help offset this with probiotics which you can buy at the store (ask your pharmacist if unable to find) or get probiotics in the form of eating yogurt. Do not eat or take the probiotics until 2 hours after your antibiotic. If you are unable to tolerate these side effects follow-up with your primary care provider or return to the emergency department.  ° °If you begin to experience any blistering, rashes, swelling, or difficulty breathing seek medical care for evaluation of potentially more serious side effects.  ° °Be sure to eat something when taking the Naproxen as it can cause stomach upset and at worst stomach bleeding. Do not take additional non steroidal anti-inflammatory medicines such as Ibuprofen, Aleve, Advil, Mobic, Diclofenac, or goodie powder while taking Naproxen. You may supplement with Tylenol.  ° °We have prescribed you new medication(s) today. Discuss the medications prescribed today with your pharmacist as they can have adverse effects and interactions with your other medicines including over the counter and prescribed medications. Seek medical evaluation if you start to experience new or abnormal symptoms after taking one of these medicines, seek care immediately if you start to experience difficulty breathing, feeling of your throat closing, facial swelling, or rash as these could be indications of a more serious allergic reaction ° °If you start to experience and new or worsening symptoms return to the emergency department. If you start to experience fever, chills, neck  stiffness/pain, or inability to move your neck or open your mouth come back to the emergency department immediately.  ° °

## 2020-03-03 NOTE — ED Triage Notes (Signed)
Pt reports having pain and swelling to mouth that started yesterday. Swelling noted on left inner cheek. Pain with eating.

## 2020-06-01 ENCOUNTER — Other Ambulatory Visit: Payer: Self-pay

## 2020-06-01 ENCOUNTER — Emergency Department (HOSPITAL_COMMUNITY)
Admission: EM | Admit: 2020-06-01 | Discharge: 2020-06-01 | Disposition: A | Discharge status: Home / Self Care | Payer: Medicaid Other | Attending: Emergency Medicine | Admitting: Emergency Medicine

## 2020-06-01 DIAGNOSIS — K047 Periapical abscess without sinus: Secondary | ICD-10-CM

## 2020-06-01 DIAGNOSIS — K0889 Other specified disorders of teeth and supporting structures: Secondary | ICD-10-CM

## 2020-06-01 DIAGNOSIS — F17290 Nicotine dependence, other tobacco product, uncomplicated: Secondary | ICD-10-CM | POA: Insufficient documentation

## 2020-06-01 MED ORDER — CHLORHEXIDINE GLUCONATE 0.12% ORAL RINSE (MEDLINE KIT): 15.0000 mL | Freq: Two times a day (BID) | OROMUCOSAL | 0 refills | Status: DC

## 2020-06-01 MED ORDER — AMOXICILLIN-POT CLAVULANATE 875-125 MG PO TABS: 1.0000 | ORAL_TABLET | Freq: Two times a day (BID) | ORAL | 0 refills | Status: DC

## 2020-06-01 NOTE — Discharge Instructions (Addendum)
You were given a prescription for antibiotics. Please take the antibiotic prescription fully. Use peridex as directed.   Please follow-up with a dentist in the next 5 to 7 days for reevaluation.  If you do not have a dentist, resources were provided for dentist in the area in your discharge summary.  Please contact one of the offices that are listed and make an appointment for follow-up.  Please return to the emergency department for any new or worsening symptoms.

## 2020-06-01 NOTE — ED Triage Notes (Signed)
C/O toothache

## 2020-06-01 NOTE — ED Provider Notes (Signed)
[facility] EMERGENCY DEPARTMENT Provider Note   CSN: [id number] Arrival date & time: 06/01/20  1338     History Chief Complaint  Patient presents with  . Dental Pain    Drew Simmons is a 29 y.o. male.  HPI   29 year old male presenting the emergency department today for evaluation of dental pain.  States he has had similar pain in the past and had antibiotics to improve symptoms.  He not follow-up with a dentist and his symptoms have now been recurring and has had pain for the last 4 days.  He also has some swelling to the gumline.  He denies any fevers or systemic symptoms.  No past medical history on file.  There are no problems to display for this patient.   No past surgical history on file.     No family history on file.  Social History   Tobacco Use  . Smoking status: Current Every Day Smoker    Types: Cigars  . Smokeless tobacco: Never Used  Vaping Use  . Vaping Use: Never used  Substance Use Topics  . Alcohol use: Yes    Comment: occ  . Drug use: Yes    Types: Marijuana    Home Medications Prior to Admission medications   Medication Sig Start Date End Date Taking? Authorizing Provider  amoxicillin-clavulanate (AUGMENTIN) 875-125 MG tablet Take 1 tablet by mouth every 12 (twelve) hours. 06/01/20   Zella Dewan S, PA-C  chlorhexidine gluconate, MEDLINE KIT, (PERIDEX) 0.12 % solution Use as directed 15 mLs in the mouth or throat 2 (two) times daily. 06/01/20   Gaspare Netzel S, PA-C  naproxen (NAPROSYN) 500 MG tablet Take 1 tablet (500 mg total) by mouth 2 (two) times daily as needed for moderate pain. 03/03/20   Petrucelli, Samantha R, PA-C  ondansetron (ZOFRAN ODT) 4 MG disintegrating tablet Take 1 tablet (4 mg total) by mouth every 8 (eight) hours as needed for nausea or vomiting. 12/24/17   Petrucelli, Samantha R, PA-C  oxyCODONE-acetaminophen (PERCOCET/ROXICET) 5-325 MG tablet Take 1-2 tablets by mouth every 6 (six) hours as needed  for severe pain. 12/24/17   Petrucelli, Samantha R, PA-C  pantoprazole (PROTONIX) 20 MG tablet Take 1 tablet (20 mg total) by mouth daily for 14 days. 01/10/18 01/24/18  Carrisa Keller S, PA-C  sucralfate (CARAFATE) 1 g tablet Take 1 tablet (1 g total) by mouth 3 (three) times daily with meals for 14 days. 01/10/18 01/24/18  Channie Bostick S, PA-C    Allergies    Patient has no known allergies.  Review of Systems   Review of Systems  Constitutional: Negative for fever.  HENT: Positive for dental problem.     Physical Exam Updated Vital Signs BP (!) 138/92 (BP Location: Left Arm)   Pulse 76   Temp 97.9 F (36.6 C) (Oral)   Resp 16   Ht 5\' 10"  (1.778 m)   Wt 72.6 kg   SpO2 100%   BMI 22.96 kg/m   Physical Exam Constitutional:      General: He is not in acute distress.    Appearance: He is well-developed and well-nourished.  HENT:     Mouth/Throat:      Comments: TTP noted to the gumline as noted above. Small amount of fluctuance noted as well. No sublingual or submandibular swelling. No trismus.  Eyes:     Conjunctiva/sclera: Conjunctivae normal.  Cardiovascular:     Rate and Rhythm: Normal rate and regular rhythm.  Pulmonary:  Effort: Pulmonary effort is normal.     Breath sounds: Normal breath sounds.  Skin:    General: Skin is warm and dry.  Neurological:     Mental Status: He is alert and oriented to person, place, and time.     ED Results / Procedures / Treatments   Labs (all labs ordered are listed, but only abnormal results are displayed) Labs Reviewed - No data to display  EKG None  Radiology No results found.  Procedures .Marland KitchenIncision and Drainage  Date/Time: 06/01/2020 6:23 PM Performed by: Rodney Booze, PA-C Authorized by: Rodney Booze, PA-C   Consent:    Consent obtained:  Verbal   Consent given by:  Patient   Risks, benefits, and alternatives were discussed: yes     Risks discussed:  Bleeding, incomplete drainage and pain    Alternatives discussed:  No treatment Universal protocol:    Procedure explained and questions answered to patient or proxy's satisfaction: yes     Patient identity confirmed:  Verbally with patient Location:    Type:  Abscess   Size:  .5   Location:  Mouth   Mouth location:  Alveolar process Sedation:    Sedation type:  None Anesthesia:    Anesthesia method:  None Procedure type:    Complexity:  Simple Procedure details:    Ultrasound guidance: no     Needle aspiration: no     Incision types:  Stab incision   Incision depth:  Dermal   Drainage:  Bloody and purulent   Drainage amount:  Scant   Wound treatment:  Wound left open   Packing materials:  None   (including critical care time)  Medications Ordered in ED Medications - No data to display  ED Course  I have reviewed the triage vital signs and the nursing notes.  Pertinent labs & imaging results that were available during my care of the patient were reviewed by me and considered in my medical decision making (see chart for details).    MDM Rules/Calculators/A&P                          Patient with toothache. Small abscess present which was incised and drained. Exam unconcerning for Ludwig's angina or spread of infection.  Will treat with abx and pain medicine.  Urged patient to follow-up with dentist.     Final Clinical Impression(s) / ED Diagnoses Final diagnoses:  Pain, dental  Dental abscess    Rx / DC Orders ED Discharge Orders         Ordered    amoxicillin-clavulanate (AUGMENTIN) 875-125 MG tablet  Every 12 hours        06/01/20 1811    chlorhexidine gluconate, MEDLINE KIT, (PERIDEX) 0.12 % solution  2 times daily        06/01/20 [address] PA-C 06/01/20 1824    Pattricia Boss, MD 06/10/20 1008

## 2023-02-24 ENCOUNTER — Other Ambulatory Visit: Payer: Self-pay

## 2023-02-24 ENCOUNTER — Emergency Department (HOSPITAL_COMMUNITY)
Admission: EM | Admit: 2023-02-24 | Discharge: 2023-02-24 | Discharge status: Against Medical Advice | Payer: Medicaid Other | Attending: Emergency Medicine | Admitting: Emergency Medicine

## 2023-02-24 ENCOUNTER — Emergency Department (HOSPITAL_COMMUNITY): Payer: Medicaid Other

## 2023-02-24 DIAGNOSIS — Z5321 Procedure and treatment not carried out due to patient leaving prior to being seen by health care provider: Secondary | ICD-10-CM | POA: Diagnosis not present

## 2023-02-24 DIAGNOSIS — R0789 Other chest pain: Secondary | ICD-10-CM | POA: Insufficient documentation

## 2023-02-24 DIAGNOSIS — Z1152 Encounter for screening for COVID-19: Secondary | ICD-10-CM | POA: Diagnosis not present

## 2023-02-24 DIAGNOSIS — R6883 Chills (without fever): Secondary | ICD-10-CM | POA: Diagnosis not present

## 2023-02-24 DIAGNOSIS — R61 Generalized hyperhidrosis: Secondary | ICD-10-CM | POA: Diagnosis not present

## 2023-02-24 DIAGNOSIS — R079 Chest pain, unspecified: Secondary | ICD-10-CM | POA: Diagnosis not present

## 2023-02-24 LAB — BASIC METABOLIC PANEL
Anion gap: 9 (ref 5–15)
BUN: 10 mg/dL (ref 6–20)
CO2: 22 mmol/L (ref 22–32)
Calcium: 9.4 mg/dL (ref 8.9–10.3)
Chloride: 106 mmol/L (ref 98–111)
Creatinine, Ser: 0.81 mg/dL (ref 0.61–1.24)
GFR, Estimated: >60 mL/min (ref >60)
Glucose, Bld: 101 mg/dL — ABNORMAL HIGH (ref 70–99)
Potassium: 3.4 mmol/L — ABNORMAL LOW (ref 3.5–5.1)
Sodium: 137 mmol/L (ref 135–145)

## 2023-02-24 LAB — CBC
HCT: 41.3 % (ref 39.0–52.0)
Hemoglobin: 14.5 g/dL (ref 13.0–17.0)
MCH: 32.2 pg (ref 26.0–34.0)
MCHC: 35.1 g/dL (ref 30.0–36.0)
MCV: 91.8 fL (ref 80.0–100.0)
Platelets: 218 10*3/uL (ref 150–400)
RBC: 4.5 MIL/uL (ref 4.22–5.81)
RDW: 12.7 % (ref 11.5–15.5)
WBC: 7.8 10*3/uL (ref 4.0–10.5)
nRBC: 0 % (ref 0.0–0.2)

## 2023-02-24 LAB — D-DIMER, QUANTITATIVE: D-Dimer, Quant: <0.27 ug{FEU}/mL (ref 0.00–0.50)

## 2023-02-24 LAB — SARS CORONAVIRUS 2 BY RT PCR: SARS Coronavirus 2 by RT PCR: NEGATIVE

## 2023-02-24 LAB — TROPONIN I (HIGH SENSITIVITY): Troponin I (High Sensitivity): <2 ng/L (ref <18)

## 2023-02-24 NOTE — ED Provider Triage Note (Signed)
Emergency Medicine Provider Triage Evaluation Note  Drew Simmons , a 32 y.o. male  was evaluated in triage.  Pt complains of chest pain. L sided chest pain for several days. Worse with inspiration. Also had chills and diaphoresis yesterday. No cardiac history.   Review of Systems  Positive: Chest pain  Negative:   Physical Exam  BP (!) 117/93 (BP Location: Right Arm)   Pulse (!) 105   Temp 98.1 F (36.7 C) (Oral)   Resp 20   Ht 5\' 10"  (1.778 m)   Wt 72.6 kg   SpO2 96%   BMI 22.96 kg/m  Gen:   Awake, no distress   Resp:  Normal effort  MSK:   Moves extremities without difficulty  Other:    Medical Decision Making  Medically screening exam initiated at 3:46 PM.  Appropriate orders placed.  Drew Hollywood was informed that the remainder of the evaluation will be completed by another provider, this initial triage assessment does not replace that evaluation, and the importance of remaining in the ED until their evaluation is complete.  Drew Navratil is a 32 y.o. male here with chest pain. Patient tachycardic. Consider PE vs pneumonia vs COVID. Will get labs, trop x 1, d-dimer, CXR, covid.    Charlynne Pander, MD 02/24/23 [phone]

## 2023-02-24 NOTE — ED Triage Notes (Signed)
Pt presents with chest pains started 2 days ago.  N&V, chills, sweats,  left arm and back pain past 4 days.  Chest tightness.

## 2023-05-20 ENCOUNTER — Other Ambulatory Visit: Payer: Self-pay

## 2023-05-20 ENCOUNTER — Encounter (HOSPITAL_COMMUNITY): Payer: Self-pay

## 2023-05-20 ENCOUNTER — Emergency Department (HOSPITAL_COMMUNITY): Payer: Medicaid Other

## 2023-05-20 ENCOUNTER — Emergency Department (HOSPITAL_COMMUNITY)
Admission: EM | Admit: 2023-05-20 | Discharge: 2023-05-20 | Disposition: A | Discharge status: Home / Self Care | Payer: Medicaid Other | Attending: Emergency Medicine | Admitting: Emergency Medicine

## 2023-05-20 DIAGNOSIS — R091 Pleurisy: Secondary | ICD-10-CM | POA: Diagnosis not present

## 2023-05-20 DIAGNOSIS — R079 Chest pain, unspecified: Secondary | ICD-10-CM | POA: Diagnosis not present

## 2023-05-20 DIAGNOSIS — M546 Pain in thoracic spine: Secondary | ICD-10-CM | POA: Insufficient documentation

## 2023-05-20 LAB — CBC
HCT: 39.9 % (ref 39.0–52.0)
Hemoglobin: 14.3 g/dL (ref 13.0–17.0)
MCH: 32.9 pg (ref 26.0–34.0)
MCHC: 35.8 g/dL (ref 30.0–36.0)
MCV: 91.9 fL (ref 80.0–100.0)
Platelets: 162 10*3/uL (ref 150–400)
RBC: 4.34 MIL/uL (ref 4.22–5.81)
RDW: 13.5 % (ref 11.5–15.5)
WBC: 9.4 10*3/uL (ref 4.0–10.5)
nRBC: 0 % (ref 0.0–0.2)

## 2023-05-20 LAB — BASIC METABOLIC PANEL
Anion gap: 7 (ref 5–15)
BUN: 11 mg/dL (ref 6–20)
CO2: 20 mmol/L — ABNORMAL LOW (ref 22–32)
Calcium: 9 mg/dL (ref 8.9–10.3)
Chloride: 107 mmol/L (ref 98–111)
Creatinine, Ser: 0.93 mg/dL (ref 0.61–1.24)
GFR, Estimated: >60 mL/min (ref >60)
Glucose, Bld: 90 mg/dL (ref 70–99)
Potassium: 4 mmol/L (ref 3.5–5.1)
Sodium: 134 mmol/L — ABNORMAL LOW (ref 135–145)

## 2023-05-20 LAB — D-DIMER, QUANTITATIVE: D-Dimer, Quant: <0.27 ug{FEU}/mL (ref 0.00–0.50)

## 2023-05-20 LAB — TROPONIN I (HIGH SENSITIVITY)
Troponin I (High Sensitivity): <2 ng/L (ref <18)
Troponin I (High Sensitivity): <2 ng/L (ref <18)

## 2023-05-20 MED ORDER — KETOROLAC TROMETHAMINE 15 MG/ML IJ SOLN
15.0000 mg | Freq: Once | INTRAMUSCULAR | Status: AC
  Administered 2023-05-20: 15 mg via INTRAVENOUS
  Filled 2023-05-20: qty 1

## 2023-05-20 NOTE — ED Provider Notes (Signed)
[facility] Provider Note   CSN: [id number] Arrival date & time: 05/20/23  5784     History  Chief Complaint  Patient presents with   Chest Pain   Shortness of Breath    Drew Simmons is a 32 y.o. male.  The history is provided by the patient.  Shortness of Breath Associated symptoms: chest pain and cough   Associated symptoms: no fever and no vomiting    He is otherwise healthy at baseline.  He reports around 2 hours ago he woke up with left-sided chest pain that is sharp and worse with breathing.  He also has pain in his upper back.  No fevers or vomiting.  He has a cough without hemoptysis.  He reports mild shortness of breath.  He he currently uses marijuana and is not interested in quitting  no previous history of CAD/VTE.  No recent travel or surgery  He reports intermittent episodes  in the past, but this is the worst episode  Home Medications Prior to Admission medications   Not on File      Allergies    Patient has no known allergies.    Review of Systems   Review of Systems  Constitutional:  Negative for fever.  Respiratory:  Positive for cough.   Cardiovascular:  Positive for chest pain. Negative for leg swelling.  Gastrointestinal:  Negative for vomiting.    Physical Exam Updated Vital Signs BP (!) 140/84 (BP Location: Left Arm)   Pulse 87   Temp 98.2 F (36.8 C) (Oral)   Resp 13   Ht 1.778 m (5\' 10" )   Wt 72.6 kg   SpO2 100%   BMI 22.97 kg/m  Physical Exam CONSTITUTIONAL: Well developed/well nourished HEAD: Normocephalic/atraumatic EYES: EOMI/PERRL ENMT: Mucous membranes moist NECK: supple no meningeal signs SPINE/BACK:entire spine nontender CV: S1/S2 noted, no murmurs/rubs/gallops noted LUNGS: Lungs are clear to auscultation bilaterally, no apparent distress ABDOMEN: soft, nontender, NEURO: Pt is awake/alert/appropriate, moves all extremitiesx4.  No facial droop.   EXTREMITIES: pulses  normal/equal, full ROM, distal pulses equal intact, no lower extremity edema SKIN: warm, color normal PSYCH: no abnormalities of mood noted, alert and oriented to situation  ED Results / Procedures / Treatments   Labs (all labs ordered are listed, but only abnormal results are displayed) Labs Reviewed  BASIC METABOLIC PANEL - Abnormal; Notable for the following components:      Result Value   Sodium 134 (*)    CO2 20 (*)    All other components within normal limits  CBC  D-DIMER, QUANTITATIVE  TROPONIN I (HIGH SENSITIVITY)  TROPONIN I (HIGH SENSITIVITY)    EKG EKG Interpretation Date/Time:  Saturday May 20 2023 02:46:50 EST Ventricular Rate:  93 PR Interval:  170 QRS Duration:  81 QT Interval:  353 QTC Calculation: 439 R Axis:   66  Text Interpretation: Sinus rhythm Baseline wander in lead(s) V1 Interpretation limited secondary to artifact Confirmed by Zadie Rhine ([id number]) on 05/20/2023 3:17:37 AM  Radiology DG Chest 2 View  Result Date: 05/20/2023 CLINICAL DATA:  Chest pain EXAM: CHEST - 2 VIEW COMPARISON:  02/24/2023 FINDINGS: The heart size and mediastinal contours are within normal limits. Both lungs are clear. The visualized skeletal structures are unremarkable. IMPRESSION: No active cardiopulmonary disease. Electronically Signed   By: Alcide Clever M.D.   On: 05/20/2023 03:08    Procedures Procedures    Medications Ordered in ED Medications  ketorolac (TORADOL) 15 MG/ML injection 15  mg (15 mg Intravenous Given 05/20/23 0443)    ED Course/ Medical Decision Making/ A&P Clinical Course as of 05/20/23 0550  Sat May 20, 2023  6295 Patient feels improved.  Labs reassuring.  Will treat as pleurisy.  Advised to cut back on smoking marijuana.  We discussed strict return precautions [DW]    Clinical Course User Index [DW] Zadie Rhine, MD             HEART Score: 0                    Medical Decision Making Amount and/or Complexity of Data  Reviewed Labs: ordered. Radiology: ordered.  Risk Prescription drug management.   This patient presents to the ED for concern of chest pain, this involves an extensive number of treatment options, and is a complaint that carries with it a high risk of complications and morbidity.  The differential diagnosis includes but is not limited to acute coronary syndrome, aortic dissection, pulmonary embolism, pericarditis, pneumothorax, pneumonia, myocarditis, pleurisy, esophageal rupture    Comorbidities that complicate the patient evaluation: Patient's presentation is complicated by their history of THC use  Social Determinants of Health: Patient's impaired access to primary care  increases the complexity of managing their presentation  Lab Tests: I Ordered, and personally interpreted labs.  The pertinent results include: Labs are overall reassuring  Imaging Studies ordered: I ordered imaging studies including X-ray chest   I independently visualized and interpreted imaging which showed no acute findings I agree with the radiologist interpretation  Cardiac Monitoring: The patient was maintained on a cardiac monitor.  I personally viewed and interpreted the cardiac monitor which showed an underlying rhythm of:  sinus rhythm  Medicines ordered and prescription drug management: I ordered medication including Toradol for pleurisy Reevaluation of the patient after these medicines showed that the patient    improved  Test Considered: Patient is low risk / negative by heart score, therefore do not feel that cardiac admission/workup is indicated.  Reevaluation: After the interventions noted above, I reevaluated the patient and found that they have :improved  Complexity of problems addressed: Patient's presentation is most consistent with  acute presentation with potential threat to life or bodily function  Disposition: After consideration of the diagnostic results and the patient's  response to treatment,  I feel that the patent would benefit from discharge   .           Final Clinical Impression(s) / ED Diagnoses Final diagnoses:  Pleurisy    Rx / DC Orders ED Discharge Orders     None         Zadie Rhine, MD 05/20/23 [phone]

## 2023-05-20 NOTE — ED Triage Notes (Addendum)
Pt reports with chest pain/rib pain that radiates into his left back x 1 hr. Pt also reports SHOB.
